# Patient Record
Sex: Female | Born: 1975 | Race: Asian | Hispanic: No | Smoking: Never smoker
Health system: Southern US, Community
[De-identification: ages and names within clinical notes are randomized; demographics above are authoritative.]

## PROBLEM LIST (undated history)

## (undated) ENCOUNTER — Inpatient Hospital Stay (HOSPITAL_COMMUNITY): Payer: Self-pay

## (undated) DIAGNOSIS — D649 Anemia, unspecified: Secondary | ICD-10-CM

## (undated) HISTORY — PX: APPENDECTOMY: SHX54

---

## 2011-01-25 ENCOUNTER — Other Ambulatory Visit: Payer: Self-pay | Admitting: Obstetrics and Gynecology

## 2011-01-25 DIAGNOSIS — O09529 Supervision of elderly multigravida, unspecified trimester: Secondary | ICD-10-CM

## 2011-01-25 LAB — HIV ANTIBODY (ROUTINE TESTING W REFLEX): HIV: NONREACTIVE

## 2011-01-25 LAB — RUBELLA ANTIBODY, IGM: Rubella: IMMUNE

## 2011-01-25 LAB — ABO/RH: RH Type: POSITIVE

## 2011-01-25 LAB — RPR: RPR: NONREACTIVE

## 2011-01-25 LAB — TYPE AND SCREEN: Antibody Screen: NEGATIVE

## 2011-02-01 ENCOUNTER — Other Ambulatory Visit: Payer: Self-pay | Admitting: Obstetrics and Gynecology

## 2011-02-01 ENCOUNTER — Ambulatory Visit (HOSPITAL_COMMUNITY)
Admission: RE | Admit: 2011-02-01 | Discharge: 2011-02-01 | Disposition: A | Discharge status: Home / Self Care | Payer: Medicaid Other | Source: Ambulatory Visit | Attending: Obstetrics and Gynecology | Admitting: Obstetrics and Gynecology

## 2011-02-01 ENCOUNTER — Encounter (HOSPITAL_COMMUNITY): Payer: Self-pay

## 2011-02-01 DIAGNOSIS — O358XX0 Maternal care for other (suspected) fetal abnormality and damage, not applicable or unspecified: Secondary | ICD-10-CM | POA: Insufficient documentation

## 2011-02-01 DIAGNOSIS — O09529 Supervision of elderly multigravida, unspecified trimester: Secondary | ICD-10-CM | POA: Insufficient documentation

## 2011-02-01 DIAGNOSIS — Z0489 Encounter for examination and observation for other specified reasons: Secondary | ICD-10-CM

## 2011-02-22 ENCOUNTER — Ambulatory Visit (HOSPITAL_COMMUNITY)
Admission: RE | Admit: 2011-02-22 | Discharge: 2011-02-22 | Disposition: A | Discharge status: Home / Self Care | Payer: Medicaid Other | Source: Ambulatory Visit | Attending: Obstetrics and Gynecology | Admitting: Obstetrics and Gynecology

## 2011-02-22 DIAGNOSIS — Z0489 Encounter for examination and observation for other specified reasons: Secondary | ICD-10-CM

## 2011-02-22 DIAGNOSIS — O09529 Supervision of elderly multigravida, unspecified trimester: Secondary | ICD-10-CM | POA: Insufficient documentation

## 2011-02-22 DIAGNOSIS — Z3689 Encounter for other specified antenatal screening: Secondary | ICD-10-CM | POA: Insufficient documentation

## 2011-02-22 DIAGNOSIS — IMO0002 Reserved for concepts with insufficient information to code with codable children: Secondary | ICD-10-CM

## 2011-05-08 LAB — STREP B DNA PROBE: GBS: NEGATIVE

## 2011-06-05 ENCOUNTER — Encounter (HOSPITAL_COMMUNITY)
Admission: AD | Disposition: A | Discharge status: Home / Self Care | Payer: Self-pay | Source: Ambulatory Visit | Attending: Obstetrics and Gynecology

## 2011-06-05 ENCOUNTER — Encounter (HOSPITAL_COMMUNITY): Payer: Self-pay | Admitting: *Deleted

## 2011-06-05 ENCOUNTER — Encounter (HOSPITAL_COMMUNITY): Payer: Self-pay | Admitting: Anesthesiology

## 2011-06-05 ENCOUNTER — Inpatient Hospital Stay (HOSPITAL_COMMUNITY): Payer: Medicaid Other | Admitting: Anesthesiology

## 2011-06-05 ENCOUNTER — Inpatient Hospital Stay (HOSPITAL_COMMUNITY)
Admission: AD | Admit: 2011-06-05 | Discharge: 2011-06-08 | DRG: 766 | Disposition: A | Discharge status: Home / Self Care | Payer: Medicaid Other | Source: Ambulatory Visit | Attending: Obstetrics and Gynecology | Admitting: Obstetrics and Gynecology

## 2011-06-05 ENCOUNTER — Inpatient Hospital Stay (HOSPITAL_COMMUNITY): Admission: RE | Admit: 2011-06-05 | Payer: Medicaid Other | Source: Ambulatory Visit

## 2011-06-05 ENCOUNTER — Other Ambulatory Visit: Payer: Self-pay | Admitting: Obstetrics and Gynecology

## 2011-06-05 DIAGNOSIS — D649 Anemia, unspecified: Secondary | ICD-10-CM | POA: Diagnosis present

## 2011-06-05 DIAGNOSIS — O24410 Gestational diabetes mellitus in pregnancy, diet controlled: Secondary | ICD-10-CM | POA: Diagnosis present

## 2011-06-05 DIAGNOSIS — Z98891 History of uterine scar from previous surgery: Secondary | ICD-10-CM

## 2011-06-05 DIAGNOSIS — O34219 Maternal care for unspecified type scar from previous cesarean delivery: Principal | ICD-10-CM | POA: Diagnosis present

## 2011-06-05 DIAGNOSIS — IMO0002 Reserved for concepts with insufficient information to code with codable children: Secondary | ICD-10-CM | POA: Diagnosis present

## 2011-06-05 DIAGNOSIS — O4100X0 Oligohydramnios, unspecified trimester, not applicable or unspecified: Secondary | ICD-10-CM | POA: Diagnosis present

## 2011-06-05 DIAGNOSIS — O09529 Supervision of elderly multigravida, unspecified trimester: Secondary | ICD-10-CM | POA: Diagnosis present

## 2011-06-05 LAB — TYPE AND SCREEN
ABO/RH(D): O POS
Antibody Screen: NEGATIVE

## 2011-06-05 LAB — CBC
HCT: 35.1 % — ABNORMAL LOW (ref 36.0–46.0)
MCHC: 32.5 g/dL (ref 30.0–36.0)
Platelets: 287 10*3/uL (ref 150–400)
RDW: 16.9 % — ABNORMAL HIGH (ref 11.5–15.5)

## 2011-06-05 LAB — ABO/RH: ABO/RH(D): O POS

## 2011-06-05 SURGERY — Surgical Case
Anesthesia: Regional | Site: Abdomen | Wound class: Clean Contaminated

## 2011-06-05 MED ORDER — MORPHINE SULFATE 10 MG/ML IJ SOLN
10.0000 mg | INTRAMUSCULAR | Status: DC | PRN
  Administered 2011-06-05: 10 mg via INTRAMUSCULAR
  Filled 2011-06-05: qty 1

## 2011-06-05 MED ORDER — SODIUM CHLORIDE 0.9 % IV SOLN
1.0000 ug/kg/h | INTRAVENOUS | Status: DC | PRN
  Filled 2011-06-05: qty 2.5

## 2011-06-05 MED ORDER — TETANUS-DIPHTH-ACELL PERTUSSIS 5-2.5-18.5 LF-MCG/0.5 IM SUSP
0.5000 mL | Freq: Once | INTRAMUSCULAR | Status: AC
  Administered 2011-06-06: 0.5 mL via INTRAMUSCULAR
  Filled 2011-06-05: qty 0.5

## 2011-06-05 MED ORDER — OXYTOCIN 20 UNITS IN LACTATED RINGERS INFUSION - SIMPLE
INTRAVENOUS | Status: AC | PRN
  Administered 2011-06-05 (×2): 20 [IU] via INTRAVENOUS

## 2011-06-05 MED ORDER — SENNOSIDES-DOCUSATE SODIUM 8.6-50 MG PO TABS
1.0000 | ORAL_TABLET | Freq: Every day | ORAL | Status: DC
  Administered 2011-06-05: 1 via ORAL
  Administered 2011-06-06: 2 via ORAL

## 2011-06-05 MED ORDER — IBUPROFEN 600 MG PO TABS
600.0000 mg | ORAL_TABLET | Freq: Four times a day (QID) | ORAL | Status: DC | PRN
  Filled 2011-06-05 (×5): qty 1

## 2011-06-05 MED ORDER — FENTANYL CITRATE 0.05 MG/ML IJ SOLN
INTRAMUSCULAR | Status: AC | PRN
  Administered 2011-06-05: 20 ug via INTRAVENOUS

## 2011-06-05 MED ORDER — WITCH HAZEL-GLYCERIN EX PADS: MEDICATED_PAD | CUTANEOUS | Status: DC | PRN

## 2011-06-05 MED ORDER — BUPIVACAINE HCL 0.75 % IJ SOLN
INTRAMUSCULAR | Status: AC | PRN
  Administered 2011-06-05: 15 mg

## 2011-06-05 MED ORDER — ZOLPIDEM TARTRATE 5 MG PO TABS: 5.0000 mg | ORAL_TABLET | Freq: Every evening | ORAL | Status: DC | PRN

## 2011-06-05 MED ORDER — DIPHENHYDRAMINE HCL 25 MG PO CAPS
25.0000 mg | ORAL_CAPSULE | Freq: Four times a day (QID) | ORAL | Status: DC | PRN
  Administered 2011-06-05: 25 mg via ORAL
  Filled 2011-06-05: qty 1

## 2011-06-05 MED ORDER — ONDANSETRON HCL 4 MG/2ML IJ SOLN
INTRAMUSCULAR | Status: AC | PRN
  Administered 2011-06-05: 4 mg via INTRAVENOUS

## 2011-06-05 MED ORDER — KETOROLAC TROMETHAMINE 30 MG/ML IJ SOLN: 30.0000 mg | Freq: Four times a day (QID) | INTRAMUSCULAR | Status: AC | PRN

## 2011-06-05 MED ORDER — KETOROLAC TROMETHAMINE 60 MG/2ML IM SOLN
60.0000 mg | Freq: Once | INTRAMUSCULAR | Status: AC | PRN
  Administered 2011-06-05: 60 mg via INTRAMUSCULAR

## 2011-06-05 MED ORDER — SIMETHICONE 80 MG PO CHEW: 80.0000 mg | CHEWABLE_TABLET | ORAL | Status: DC | PRN

## 2011-06-05 MED ORDER — IBUPROFEN 600 MG PO TABS
600.0000 mg | ORAL_TABLET | Freq: Four times a day (QID) | ORAL | Status: DC
  Administered 2011-06-06 – 2011-06-08 (×10): 600 mg via ORAL
  Filled 2011-06-05 (×6): qty 1

## 2011-06-05 MED ORDER — ONDANSETRON HCL 4 MG PO TABS: 4.0000 mg | ORAL_TABLET | ORAL | Status: DC | PRN

## 2011-06-05 MED ORDER — SIMETHICONE 80 MG PO CHEW
80.0000 mg | CHEWABLE_TABLET | Freq: Three times a day (TID) | ORAL | Status: DC
  Administered 2011-06-05 – 2011-06-08 (×8): 80 mg via ORAL

## 2011-06-05 MED ORDER — CITRIC ACID-SODIUM CITRATE 334-500 MG/5ML PO SOLN
30.0000 mL | Freq: Three times a day (TID) | ORAL | Status: DC
  Administered 2011-06-05: 30 mL via ORAL
  Filled 2011-06-05: qty 15

## 2011-06-05 MED ORDER — NALOXONE HCL 0.4 MG/ML IJ SOLN: 0.4000 mg | INTRAMUSCULAR | Status: DC | PRN

## 2011-06-05 MED ORDER — SODIUM CHLORIDE 0.9 % IR SOLN
Status: DC | PRN
  Administered 2011-06-05: 1000 mL

## 2011-06-05 MED ORDER — NALBUPHINE HCL 10 MG/ML IJ SOLN
5.0000 mg | INTRAMUSCULAR | Status: AC | PRN
  Filled 2011-06-05: qty 1

## 2011-06-05 MED ORDER — OXYCODONE-ACETAMINOPHEN 5-325 MG PO TABS
1.0000 | ORAL_TABLET | ORAL | Status: DC | PRN
  Administered 2011-06-05 – 2011-06-06 (×2): 2 via ORAL
  Administered 2011-06-06: 1 via ORAL
  Administered 2011-06-06: 2 via ORAL
  Administered 2011-06-06: 1 via ORAL
  Administered 2011-06-06 – 2011-06-08 (×9): 2 via ORAL
  Filled 2011-06-05: qty 1
  Filled 2011-06-05 (×2): qty 2
  Filled 2011-06-05: qty 1
  Filled 2011-06-05 (×3): qty 2
  Filled 2011-06-05: qty 1
  Filled 2011-06-05 (×4): qty 2
  Filled 2011-06-05: qty 1
  Filled 2011-06-05 (×2): qty 2

## 2011-06-05 MED ORDER — MORPHINE SULFATE (PF) 0.5 MG/ML IJ SOLN
INTRAMUSCULAR | Status: DC | PRN
  Administered 2011-06-05: .15 mg via EPIDURAL

## 2011-06-05 MED ORDER — LACTATED RINGERS IV SOLN
INTRAVENOUS | Status: DC
  Administered 2011-06-05 – 2011-06-06 (×6): via INTRAVENOUS

## 2011-06-05 MED ORDER — KETOROLAC TROMETHAMINE 60 MG/2ML IM SOLN
INTRAMUSCULAR | Status: AC
  Administered 2011-06-05: 60 mg via INTRAMUSCULAR
  Filled 2011-06-05: qty 2

## 2011-06-05 MED ORDER — FERROUS SULFATE 325 (65 FE) MG PO TABS
325.0000 mg | ORAL_TABLET | Freq: Two times a day (BID) | ORAL | Status: DC
  Administered 2011-06-06 – 2011-06-08 (×5): 325 mg via ORAL
  Filled 2011-06-05 (×5): qty 1

## 2011-06-05 MED ORDER — CEFAZOLIN SODIUM 1-5 GM-% IV SOLN
INTRAVENOUS | Status: AC | PRN
  Administered 2011-06-05: 1 g via INTRAVENOUS

## 2011-06-05 MED ORDER — MEASLES, MUMPS & RUBELLA VAC ~~LOC~~ INJ
0.5000 mL | INJECTION | Freq: Once | SUBCUTANEOUS | Status: DC
  Filled 2011-06-05: qty 0.5

## 2011-06-05 MED ORDER — SODIUM CHLORIDE 0.9 % IJ SOLN: 3.0000 mL | INTRAMUSCULAR | Status: DC | PRN

## 2011-06-05 MED ORDER — MEDROXYPROGESTERONE ACETATE 150 MG/ML IM SUSP: 150.0000 mg | INTRAMUSCULAR | Status: DC | PRN

## 2011-06-05 MED ORDER — ONDANSETRON HCL 4 MG/2ML IJ SOLN: 4.0000 mg | INTRAMUSCULAR | Status: DC | PRN

## 2011-06-05 MED ORDER — MENTHOL 3 MG MT LOZG: 1.0000 | LOZENGE | OROMUCOSAL | Status: DC | PRN

## 2011-06-05 MED ORDER — MAGNESIUM HYDROXIDE 400 MG/5ML PO SUSP: 30.0000 mL | ORAL | Status: DC | PRN

## 2011-06-05 MED ORDER — PRENATAL PLUS 27-1 MG PO TABS
1.0000 | ORAL_TABLET | Freq: Every day | ORAL | Status: DC
  Administered 2011-06-06 – 2011-06-08 (×3): 1 via ORAL
  Filled 2011-06-05 (×3): qty 1

## 2011-06-05 SURGICAL SUPPLY — 33 items
CLOTH BEACON ORANGE TIMEOUT ST (SAFETY) ×2 IMPLANT
DERMABOND ADVANCED (GAUZE/BANDAGES/DRESSINGS) IMPLANT
DRAPE UTILITY XL STRL (DRAPES) ×2 IMPLANT
DRESSING TELFA 8X3 (GAUZE/BANDAGES/DRESSINGS) IMPLANT
DURAPREP 26ML APPLICATOR (WOUND CARE) ×2 IMPLANT
ELECT REM PT RETURN 9FT ADLT (ELECTROSURGICAL) ×2
ELECTRODE REM PT RTRN 9FT ADLT (ELECTROSURGICAL) ×1 IMPLANT
EXTRACTOR VACUUM M CUP 4 TUBE (SUCTIONS) ×2 IMPLANT
GAUZE SPONGE 4X4 12PLY STRL LF (GAUZE/BANDAGES/DRESSINGS) IMPLANT
GLOVE BIO SURGEON STRL SZ 6.5 (GLOVE) ×2 IMPLANT
GLOVE BIOGEL PI IND STRL 7.0 (GLOVE) ×1 IMPLANT
GLOVE BIOGEL PI INDICATOR 7.0 (GLOVE) ×1
GLOVE ECLIPSE 6.0 STRL STRAW (GLOVE) ×2 IMPLANT
GLOVE INDICATOR 6.5 STRL GRN (GLOVE) ×2 IMPLANT
GLOVE INDICATOR 8.5 STRL (GLOVE) ×2 IMPLANT
GOWN BRE IMP SLV AUR LG STRL (GOWN DISPOSABLE) ×4 IMPLANT
GOWN BRE IMP SLV AUR XL STRL (GOWN DISPOSABLE) ×4 IMPLANT
KIT ABG SYR 3ML LUER SLIP (SYRINGE) IMPLANT
NEEDLE HYPO 25X5/8 SAFETYGLIDE (NEEDLE) IMPLANT
NS IRRIG 1000ML POUR BTL (IV SOLUTION) ×2 IMPLANT
PACK C SECTION WH (CUSTOM PROCEDURE TRAY) ×2 IMPLANT
PAD ABD 7.5X8 STRL (GAUZE/BANDAGES/DRESSINGS) IMPLANT
RTRCTR C-SECT PINK 25CM LRG (MISCELLANEOUS) IMPLANT
SLEEVE SCD COMPRESS KNEE MED (MISCELLANEOUS) IMPLANT
SUT CHROMIC 2 0 CT 1 (SUTURE) ×2 IMPLANT
SUT PLAIN 2 0 (SUTURE)
SUT PLAIN 2 0 XLH (SUTURE) ×2 IMPLANT
SUT PLAIN ABS 2-0 54XMFL TIE (SUTURE) IMPLANT
SUT VIC AB 0 CT1 36 (SUTURE) ×8 IMPLANT
SUT VIC AB 4-0 KS 27 (SUTURE) ×2 IMPLANT
TOWEL OR 17X24 6PK STRL BLUE (TOWEL DISPOSABLE) ×4 IMPLANT
TRAY FOLEY CATH 14FR (SET/KITS/TRAYS/PACK) IMPLANT
WATER STERILE IRR 1000ML POUR (IV SOLUTION) ×2 IMPLANT

## 2011-06-05 NOTE — Brief Op Note (Signed)
06/05/2011  9:57 AM  PATIENT:  Angelica Steele  35 y.o. female  PRE-OPERATIVE DIAGNOSIS:  previous cesarean section, in labor  POST-OPERATIVE DIAGNOSIS:  baby girl @ 0919  PROCEDURE:  Procedure(s): CESAREAN SECTION  SURGEON:  Surgeon(s): Fortino Sic, MD  PHYSICIAN ASSISTANT:   ASSISTANTS: none   ANESTHESIA:   spinal  ESTIMATED BLOOD LOSS: * No blood loss amount entered *   BLOOD ADMINISTERED:none  DRAINS: Urinary Catheter (Foley)   LOCAL MEDICATIONS USED:  NONE  SPECIMEN:  Source of Specimen:  placenta  DISPOSITION OF SPECIMEN:  PATHOLOGY  COUNTS:  YES    PLAN OF CARE:admit to mother baby  PATIENT DISPOSITION:  PACU - hemodynamically stable.   Delay start of Pharmacological VTE agent (>24hrs) due to surgical blood loss or risk of bleeding:  not applicable

## 2011-06-05 NOTE — H&P (Signed)
Angelica Steele is a 35 y.o. female presenting for Repeat Cesarean section. Maternal Medical History:  Reason for admission: Reason for admission: contractions.  40 wks, previous Cesarean section  Contractions: Onset was more than 2 days ago.   Frequency: irregular.   Duration is approximately 30 seconds.   Perceived severity is mild.    Fetal activity: Perceived fetal activity is normal.   Last perceived fetal movement was within the past hour.    Prenatal complications: IUGR and oligohydramnios.   Prenatal Complications - Diabetes: gestational. Diabetes is managed by diet.      OB History    Grav Para Term Preterm Abortions TAB SAB Ect Mult Living   2 1 1  0 0 0 0 0 0 1     Past Medical History  Diagnosis Date  . Diabetes mellitus Gestional Diet Controlled   Past Surgical History  Procedure Date  . Cesarean section   . Appendectomy 44yrs ago   Family History: family history is not on file. Social History:  reports that she has never smoked. She has never used smokeless tobacco. She reports that she does not drink alcohol or use illicit drugs.  Review of Systems  All other systems reviewed and are negative.    Dilation: 1.5 Effacement (%): 60 Station: -1 Exam by:: Mirage Pfefferkorn Blood pressure 137/90, pulse 77, height 5\' 2"  (1.575 m), weight 68.493 kg (151 lb). Maternal Exam:  Introitus: Normal vulva. Normal vagina.  Ferning test: not done.  Nitrazine test: not done. Amniotic fluid character: not assessed.  Pelvis: questionable for delivery.   Cervix: Cervix evaluated by digital exam.   1-2/60-70/0 to-1  vertex  Fetal Exam Fetal Monitor Review: Baseline rate: 140.  Variability: marked (>25 bpm).   Pattern: accelerations present and no decelerations.    Fetal State Assessment: Category I - tracings are normal.     Physical Exam  Vitals reviewed. Constitutional: She appears well-developed.  HENT:  Head: Normocephalic.  Neck: Neck supple.  Cardiovascular: Normal  rate.   Respiratory: Effort normal and breath sounds normal.  GI: Bowel sounds are normal.  Genitourinary: Vagina normal.  Musculoskeletal: Normal range of motion.  Neurological: She is alert.  Skin: Skin is warm.  Psychiatric: She has a normal mood and affect.    Prenatal labs: ABO, Rh:   Antibody: Negative (03/07 0000) Rubella:   RPR: Nonreactive (03/07 0000)  HBsAg: Negative (03/07 0000)  HIV: Non-reactive (03/07 0000)  GBS: Negative (06/18 0000)   Assessment/Plan: Repeat Low transverse Cesarean Section   Lamonta Cypress E 06/05/2011, 8:26 AM

## 2011-06-05 NOTE — Anesthesia Preprocedure Evaluation (Addendum)
Anesthesia Evaluation  Name, MR# and DOB Patient awake  General Assessment Comment  Reviewed: Allergy & Precautions, H&P  and Patient's Chart, lab work & pertinent test results  Airway Mallampati: II TM Distance: >3 FB Neck ROM: full    Dental No notable dental hx    Pulmonary  clear to auscultation  pulmonary exam normal   Cardiovascular Exercise Tolerance: Good regular Normal   Neuro/Psych  GI/Hepatic/Renal   Endo/Other   Abdominal   Musculoskeletal  Hematology   Peds  Reproductive/Obstetrics   Anesthesia Other Findings 11/35   [id number]            Anesthesia Physical Anesthesia Plan  ASA: II  Anesthesia Plan: Spinal   Post-op Pain Management:    Induction:   Airway Management Planned:   Additional Equipment:   Intra-op Plan:   Post-operative Plan:   Informed Consent: I have reviewed the patients History and Physical, chart, labs and discussed the procedure including the risks, benefits and alternatives for the proposed anesthesia with the patient or authorized representative who has indicated his/her understanding and acceptance.     Plan Discussed with:   Anesthesia Plan Comments: (Discussed spinal, and patient consents to the procedure:  included risk of possible headache,backache, failed block, allergic reaction, and nerve injury. This patient was asked if she had any questions or concerns before the procedure started )       Anesthesia Quick Evaluation

## 2011-06-05 NOTE — Consult Note (Signed)
Called to attend delivery at term gestation to a mother who is gestational diabetic on diet control. She has had a prior C/S but this is  privileged information not to be shared with the father or other family members. At birth infant in vertex, manually extracted after AROM (clear fluid) with spontaneous cries and active MAE . Infant given tactile stim and bulb suction to the airways. A nuchal cord (loose) x 1 was reported.   Infant given Apgar scores of 9/9 at one and five minutes.  RN kept infant in OR with mother.  Care to assigned pediatrician.  Judith Blonder MD Attending Neonatologist [facility]

## 2011-06-05 NOTE — Anesthesia Procedure Notes (Addendum)
Spinal Block  Patient location during procedure: OR Staffing Anesthesiologist: JACKSON, KYLE EDWARD Preanesthetic Checklist Completed: patient identified, site marked, surgical consent, pre-op evaluation, timeout performed, IV checked, risks and benefits discussed and monitors and equipment checked Spinal Block Patient position: sitting Prep: DuraPrep Patient monitoring: heart rate, cardiac monitor, continuous pulse ox and blood pressure Approach: midline Location: L3-4 Injection technique: single-shot Needle Needle type: Sprotte  Needle gauge: 24 G Needle length: 9 cm Assessment Sensory level: T4 Additional Notes Spinal Dosage in OR  Bupivicaine ml       1.5 PFMS04   mcg        150 Fentanyl mcg            20    

## 2011-06-05 NOTE — Anesthesia Postprocedure Evaluation (Signed)
  Anesthesia Post-op Note  Patient: Angelica Steele  Procedure(s) Performed:  CESAREAN SECTION  Patient is awake, responsive, moving her legs, and has signs of resolution of her numbness. Pain and nausea are reasonably well controlled. Vital signs are stable and clinically acceptable. Oxygen saturation is clinically acceptable. There are no apparent anesthetic complications at this time. Patient is ready for discharge.

## 2011-06-06 LAB — CBC
HCT: 25.7 % — ABNORMAL LOW (ref 36.0–46.0)
MCHC: 31.9 g/dL (ref 30.0–36.0)
Platelets: 201 10*3/uL (ref 150–400)
RDW: 17 % — ABNORMAL HIGH (ref 11.5–15.5)

## 2011-06-06 MED ORDER — CEFAZOLIN SODIUM 1-5 GM-% IV SOLN
1.0000 g | INTRAVENOUS | Status: AC
  Filled 2011-06-06: qty 50

## 2011-06-06 MED ORDER — FENTANYL CITRATE 0.05 MG/ML IJ SOLN: 25.0000 ug | INTRAMUSCULAR | Status: DC | PRN

## 2011-06-06 MED ORDER — ACETAMINOPHEN 325 MG PO TABS: 325.0000 mg | ORAL_TABLET | ORAL | Status: DC | PRN

## 2011-06-06 MED ORDER — OXYTOCIN 20 UNITS IN LACTATED RINGERS INFUSION - SIMPLE: 125.0000 mL/h | INTRAVENOUS | Status: AC

## 2011-06-06 MED ORDER — ONDANSETRON HCL 4 MG/2ML IJ SOLN: 4.0000 mg | Freq: Once | INTRAMUSCULAR | Status: AC | PRN

## 2011-06-06 MED ORDER — MEPERIDINE HCL 25 MG/ML IJ SOLN: 6.2500 mg | INTRAMUSCULAR | Status: DC | PRN

## 2011-06-06 NOTE — H&P (Addendum)
NAMEELARIA, Angelica Steele NO.:  [id number]  MEDICAL RECORD NO.:  [id number]  LOCATION:  9147                          FACILITY:  [facility]  PHYSICIAN:  Arlyce Harman, MD     DATE OF BIRTH:  [birth year]  DATE OF ADMISSION:  06/05/2011 DATE OF DISCHARGE:   Error: This is an op note not H & P. H & P separate.   PREOPERATIVE DIAGNOSES:  A 40- weeks pregnant, previous cesarean section, advanced maternal age, oligohydramnios, SGA, gestational diabetes, diet-controlled, anemia.  POSTOPERATIVE DIAGNOSES:  40- weeks pregnant, previous cesarean section, advanced maternal age, oligohydramnios, SGA, gestational diabetes, diet- controlled, anemia.  PROCEDURE:  Repeat low-transverse cervical cesarean section.  SURGEON:  Arlyce Harman, M.D.  No designated assistance.  DESCRIPTION OF FINDINGS:  A healthy, viable female with excellent Apgars, weight was 6 pounds, 3 ounces.  INDICATIONS AND CONSENT:  The patient is a 35 year old gravida 2, para 1 who recently informed me that she had a previous C-section in [place] and had a 8+ pound daughter that she had given for adoption.  This is information that she did not previously share because she did not want her husband to be aware of the previous pregnancy.  We discussed options for delivery and VBAC was considered; however, later declined.  Patient was admitted into labor and delivery on 7/16, having contractions and requested a cesarean section.  Risks, possible complications of cesarean section versus VBAC have been previously explained and informed consent was given for repeat C-section.  PROCEDURE IN DETAIL:  The patient was placed on a table in supine after spinal anesthesia had been induced.  The abdomen was sterilely prepped and draped in usual fashion after Foley catheter had been placed.  A Pfannenstiel incision was made into the skin, extended to the abdominal layer without difficulty.  The uterus was entered in a  low-transverse cervical fashion.  The membranes were brought forth and ruptured.  The fluid was clear.  The incision was extended and then the infant's head was delivered to know the mouth, was suctioned.  There was a nuchal cord x1, draped over the shoulder.  The infant was delivered without difficulty, it was a female with excellent Apgars.  The cord was clamped and cut and the infant was handed off to the pediatrician.  The placenta was manually removed.  The uterus was punched out and the uterine incision was closed with 0 Vicryl in a running locked fashion with second layer being that of a modified Lambert stitch.  The peritoneum was closed with 2-0 chromic in a running fashion.  The abdomen was irrigated and suctioned out.  Hemostasis was excellent.  The peritoneum was closed with 2-0 chromic in a running fashion.  The fascia was closed with 0 Vicryl in a running locked fashion.  Subcu layer was closed with 2-0 plain in a running fashion.  The skin was approximated using a Mellody Dance needle and a 4-0 Vicryl suture in a subcuticular fashion.  At this point, the procedure was terminated.  The patient was taken to the PACU in excellent condition.  ESTIMATED BLOOD LOSS:  1000 cc.     Arlyce Harman, MD    EG/MEDQ  D:  06/06/2011  T:  06/06/2011  Job:  [id number]

## 2011-06-06 NOTE — Progress Notes (Signed)
BREASTFEEDING CONSULTATION SERVICES INFORMATION GIVEN TO PATIENT.  PT TEARFUL DUE TO ABDOMINAL PAIN.  HUSBAND STATES PATIENT HAS BEEN INSTRUCTED AND ASSISTED WITH BASIC BREASTFEEDING TECHNIQUES BY [facility] RN'S AND THEY ARE FEELING CONFIDENT WITH FEEDINGS.  INSTRUCTED TO CALL FOR ASSIST OR CONCERNS WHEN NEEDED.

## 2011-06-06 NOTE — Progress Notes (Signed)
Subjective: Postpartum Day 1: Cesarean Delivery Patient reports no problems voiding.    Objective: Vital signs in last 24 hours: Temp:  [97.9 F (36.6 C)-98.6 F (37 C)] 98.5 F (36.9 C) (07/17 1359) Pulse Rate:  [62-87] 87  (07/17 1359) Resp:  [16-18] 17  (07/17 1359) BP: (98-132)/(65-85) 126/85 mmHg (07/17 1359) SpO2:  [95 %-98 %] 98 % (07/17 0847)  Physical Exam:  General: alert and no distress Lochia: appropriate Uterine Fundus: firm Incision: no significant drainage DVT Evaluation: No evidence of DVT seen on physical exam.   Basename 06/06/11 0515 06/05/11 0800  HGB 8.2* 11.4*  HCT 25.7* 35.1*    Assessment/Plan: Status post Cesarean section. Doing well postoperatively.  Continue current care.  Fortino Sic 06/06/2011, 4:25 PM

## 2011-06-06 NOTE — Progress Notes (Signed)
UR Chart review completed.  

## 2011-06-07 NOTE — Progress Notes (Signed)
Post Partum Day 2 Subjective: no complaints, up ad lib, voiding, tolerating PO and + flatus  Objective: Blood pressure 124/77, pulse 77, temperature 97.9 F (36.6 C), temperature source Oral, resp. rate 18, height 5\' 2"  (1.575 m), weight 68.493 kg (151 lb), SpO2 97.00%, unknown if currently breastfeeding.  Physical Exam:  General: alert and cooperative Lochia: appropriate Uterine Fundus: firm Incision: bandage clean and intact  DVT Evaluation: No evidence of DVT seen on physical exam.   Basename 06/06/11 0515 06/05/11 0800  HGB 8.2* 11.4*  HCT 25.7* 35.1*    Assessment/Plan: Plan for discharge tomorrow and Breastfeeding   LOS: 2 days   Angelica Steele J. 06/07/2011, 8:43 AM

## 2011-06-07 NOTE — Progress Notes (Signed)
Baby has missed a few feedings today, 4 1/2 hours between feedings this am, last feeding before this visit was 1630, parents report baby very fussy at breast. Attempted to latch baby, would latch and immediately come off breast becoming very fussy, will suck frantically on pacifier. Discussed supplementing with parents with medicine dropper to give appetizer to get baby to breast, parents agreed. Baby eagerly took 9ml of formula from medicine dropper, then attempted to latch to left breast. Baby still fussy, put small amount of formula on nipple and baby latched well and began rhythmic suck. Mom breast not symmetrical, left breast significantly larger than right. Mom reports breast changes in pregnancy, medical history of GDM. DEBP set up for mom and encouraged to begin postpumping for 15 minutes to encourage milk production. Advised if baby not breastfeeding every 3 hours for 15-20 minutes with active sucking and swallowing to supplement with 10-15 ml of EMB or EMB/formula with medicine dropper. Has WIC and plans to get pump from [facility]. Advised to follow up with Lactation before d/c tomorrow.

## 2011-06-08 MED ORDER — FERROUS SULFATE 325 (65 FE) MG PO TABS: 325.0000 mg | ORAL_TABLET | Freq: Two times a day (BID) | ORAL | Status: DC

## 2011-06-08 MED ORDER — IBUPROFEN 600 MG PO TABS: 600.0000 mg | ORAL_TABLET | Freq: Four times a day (QID) | ORAL | Status: AC | PRN

## 2011-06-08 MED ORDER — OXYCODONE-ACETAMINOPHEN 5-325 MG PO TABS: 1.0000 | ORAL_TABLET | ORAL | Status: AC | PRN

## 2011-06-08 NOTE — Progress Notes (Signed)
UR Chart review completed.  

## 2011-06-08 NOTE — Progress Notes (Signed)
  POD #3 s/p Ltcs Pt states pain is controlled. Decreased lochia Af vss Abdomen soft appropriately tender uterus firm  inciison c/d i Ext trace edema A/p pod #3 ltcs doing well  D/c home follow up with Dr. Neva Seat

## 2011-06-08 NOTE — Discharge Summary (Signed)
Obstetric Discharge Summary Reason for Admission: cesarean section Prenatal Procedures: none Intrapartum Procedures: cesarean: low cervical, transverse Postpartum Procedures: none Complications-Operative and Postpartum: none  Hemoglobin  Date Value Range Status  06/06/2011 8.2* 12.0-15.0 (g/dL) Final     REPEATED TO VERIFY     DELTA CHECK NOTED     HCT  Date Value Range Status  06/06/2011 25.7* 36.0-46.0 (%) Final    Discharge Diagnoses: Term Pregnancy-delivered  Discharge Information: Date: 06/08/2011 Activity: unrestricted Diet: routine Medications: PNV, Ibuprophen and Percocet Condition: stable Instructions: refer to practice specific booklet Discharge to: home Follow-up Information    Follow up with Fortino Sic, MD. Make an appointment in 2 weeks.   Contact information:   [address] [place] [address] [phone]          Newborn Data: Live born  Information for the patient's newborn:  Idamae, Coccia [[id number]]  female ; APGAR , ; weight ;  Home with .  Suvi Archuletta J. 06/08/2011, 6:41 AM

## 2011-06-08 NOTE — Progress Notes (Signed)
1st visited mom at 0900 , infant had recently fed . Due to 8% wt loss LC requested staying to check a latch check at the next feed .  (around 1130 ) . In the mean time , Discussed mom being on [employer] [facility] [employer] and recenty moved to [place] but had not transferred [facility] . LC called [facility] ,and they could not promise to give mom an appointment today. To make it easier on mom will give a [facility] loaner and 7 day loaner explained .Per Dad ok with renting if needed. Encouraged to call [facility] for  An appointment . Also reviewed engorgement tx. If needed .

## 2011-06-14 NOTE — Op Note (Signed)
Fortino Sic, MD Physician Signed H&P 06/05/2011 12:00 AM   NAME: Angelica Steele, Angelica Steele ACCOUNT NO.: [id number]  MEDICAL RECORD NO.: [id number]  LOCATION: 9147 FACILITY: [facility]  PHYSICIAN: Arlyce Harman, MD DATE OF BIRTH: [birth year]  DATE OF ADMISSION: 06/05/2011  DATE OF DISCHARGE:  HISTORY & PHYSICAL  PREOPERATIVE DIAGNOSES: A 40- weeks pregnant, previous cesarean  section, advanced maternal age, oligohydramnios, SGA, gestational  diabetes, diet-controlled, anemia.  POSTOPERATIVE DIAGNOSES: 40- weeks pregnant, previous cesarean section,  advanced maternal age, oligohydramnios, SGA, gestational diabetes, diet-  controlled, anemia.  PROCEDURE: Repeat low-transverse cervical cesarean section.  SURGEON: Arlyce Harman, M.D.  No designated assistance.  DESCRIPTION OF FINDINGS: A healthy, viable female with excellent  Apgars, weight was 6 pounds, 3 ounces.  INDICATIONS AND CONSENT: The patient is a 35 year old gravida 2, para 1  who recently informed me that she had a previous C-section in [place]  and had a 8+ pound daughter that she had given for adoption. This is  information that she did not previously share because she did not want  her husband to be aware of the previous pregnancy. We discussed options  for delivery and VBAC was considered; however, later declined. Patient  was admitted into labor and delivery on 7/16, having contractions and  requested a cesarean section. Risks, possible complications of cesarean  section versus VBAC have been previously explained and informed consent  was given for repeat C-section.  PROCEDURE IN DETAIL: The patient was placed on a table in supine after  spinal anesthesia had been induced. The abdomen was sterilely prepped  and draped in usual fashion after Foley catheter had been placed. A  Pfannenstiel incision was made into the skin, extended to the abdominal  layer without difficulty. The uterus was entered in a low-transverse  cervical fashion. The  membranes were brought forth and ruptured. The  fluid was clear. The incision was extended and then the infant's head  was delivered to know the mouth, was suctioned. There was a nuchal cord  x1, draped over the shoulder. The infant was delivered without  difficulty, it was a female with excellent Apgars. The cord was clamped  and cut and the infant was handed off to the pediatrician. The placenta  was manually removed. The uterus was punched out and the uterine  incision was closed with 0 Vicryl in a running locked fashion with  second layer being that of a modified Lambert stitch. The peritoneum  was closed with 2-0 chromic in a running fashion. The abdomen was  irrigated and suctioned out. Hemostasis was excellent. The peritoneum  was closed with 2-0 chromic in a running fashion. The fascia was closed  with 0 Vicryl in a running locked fashion. Subcu layer was closed with  2-0 plain in a running fashion. The skin was approximated using a  Mellody Dance needle and a 4-0 Vicryl suture in a subcuticular fashion. At this  point, the procedure was terminated. The patient was taken to the PACU  in excellent condition.  ESTIMATED BLOOD LOSS: 1000 cc.

## 2011-06-21 ENCOUNTER — Encounter (HOSPITAL_COMMUNITY): Payer: Self-pay | Admitting: Obstetrics and Gynecology

## 2012-01-29 ENCOUNTER — Other Ambulatory Visit: Payer: Self-pay | Admitting: Obstetrics and Gynecology

## 2012-01-29 ENCOUNTER — Other Ambulatory Visit (HOSPITAL_COMMUNITY)
Admission: RE | Admit: 2012-01-29 | Discharge: 2012-01-29 | Disposition: A | Discharge status: Home / Self Care | Payer: Medicaid Other | Source: Ambulatory Visit | Attending: Obstetrics and Gynecology | Admitting: Obstetrics and Gynecology

## 2012-01-29 DIAGNOSIS — Z124 Encounter for screening for malignant neoplasm of cervix: Secondary | ICD-10-CM | POA: Insufficient documentation

## 2012-07-09 ENCOUNTER — Other Ambulatory Visit: Payer: Self-pay | Admitting: Obstetrics and Gynecology

## 2012-10-20 ENCOUNTER — Encounter (HOSPITAL_COMMUNITY): Payer: Self-pay | Admitting: *Deleted

## 2012-10-20 ENCOUNTER — Inpatient Hospital Stay (HOSPITAL_COMMUNITY)
Admission: AD | Admit: 2012-10-20 | Discharge: 2012-10-20 | Disposition: A | Discharge status: Home / Self Care | Payer: Medicaid Other | Source: Ambulatory Visit | Attending: Obstetrics and Gynecology | Admitting: Obstetrics and Gynecology

## 2012-10-20 ENCOUNTER — Inpatient Hospital Stay (HOSPITAL_COMMUNITY): Payer: Medicaid Other

## 2012-10-20 DIAGNOSIS — O039 Complete or unspecified spontaneous abortion without complication: Secondary | ICD-10-CM | POA: Insufficient documentation

## 2012-10-20 LAB — URINALYSIS, ROUTINE W REFLEX MICROSCOPIC
Nitrite: NEGATIVE
Protein, ur: NEGATIVE mg/dL
Specific Gravity, Urine: 1.01 (ref 1.005–1.030)
Urobilinogen, UA: 0.2 mg/dL (ref 0.0–1.0)

## 2012-10-20 LAB — URINE MICROSCOPIC-ADD ON

## 2012-10-20 LAB — WET PREP, GENITAL

## 2012-10-20 MED ORDER — IBUPROFEN 800 MG PO TABS: 800.0000 mg | ORAL_TABLET | Freq: Three times a day (TID) | ORAL | Status: DC | PRN

## 2012-10-20 NOTE — MAU Note (Signed)
Pt reports she has had spotting since yesterday and started having dull cramping today.

## 2012-10-20 NOTE — MAU Note (Signed)
"  Bleeding started Friday light pink then went away.  It returned on Saturday as spotting all day.  The lower abd started this afternoon about 1400.  The pain is dull.  I am soaking a pad every 2-3 hours.  The bleeding is like my normal period.  There has been clots a few times."

## 2012-10-20 NOTE — MAU Provider Note (Signed)
History     CSN: [id number]  Arrival date and time: 10/20/12 1737   First Provider Initiated Contact with Patient 10/20/12 1826      Chief Complaint  Patient presents with  . Vaginal Bleeding   HPI 36 y.o. G2P1001 at [redacted]w[redacted]d with vaginal bleeding since Friday, spotting at first, now like a period. Dull pain starting today. Intercourse on Friday after bleeding started.  Past Medical History  Diagnosis Date  . Diabetes mellitus Gestional Diet Controlled  . No pertinent past medical history     Past Surgical History  Procedure Date  . Cesarean section   . Appendectomy 2yrs ago  . Cesarean section 06/05/2011    Procedure: CESAREAN SECTION;  Surgeon: Fortino Sic, MD;  Location: [facility];  Service: Gynecology;  Laterality: N/A;    History reviewed. No pertinent family history.  History  Substance Use Topics  . Smoking status: Never Smoker   . Smokeless tobacco: Never Used  . Alcohol Use: No    Allergies: No Known Allergies  Prescriptions prior to admission  Medication Sig Dispense Refill  . Multiple Vitamins-Minerals (MULTIVITAMIN GUMMIES ADULT PO) Take 2 tablets by mouth daily.      . Prenatal Vit-Fe Fumarate-FA (PRENATAL MULTIVITAMIN) TABS Take 1 tablet by mouth daily.        Review of Systems  Constitutional: Negative.   Respiratory: Negative.   Cardiovascular: Negative.   Gastrointestinal: Negative for nausea, vomiting, abdominal pain, diarrhea and constipation.  Genitourinary: Negative for dysuria, urgency, frequency, hematuria and flank pain.       + vaginal bleeding   Musculoskeletal: Negative.   Neurological: Negative.   Psychiatric/Behavioral: Negative.    Physical Exam   Blood pressure 157/90, pulse 85, temperature 98.2 F (36.8 C), temperature source Oral, resp. rate 18, height 5\' 2"  (1.575 m), weight 143 lb 6.4 oz (65.046 kg).  Physical Exam  Nursing note and vitals reviewed. Constitutional: She is oriented to person, place, and time. She  appears well-developed and well-nourished. No distress.  HENT:  Head: Normocephalic and atraumatic.  Cardiovascular: Normal rate and regular rhythm.   Respiratory: Effort normal. No respiratory distress.  GI: Soft. She exhibits no distension and no mass. There is no tenderness. There is no rebound and no guarding.  Genitourinary: There is no rash or lesion on the right labia. There is no rash or lesion on the left labia. Uterus is not deviated, not enlarged, not fixed and not tender. Cervix exhibits no motion tenderness, no discharge and no friability. Right adnexum displays tenderness. Right adnexum displays no mass and no fullness. Left adnexum displays no mass, no tenderness and no fullness. There is bleeding (moderate, small clot in cervix) around the vagina. No erythema or tenderness around the vagina. No vaginal discharge found.       Cervix closed   Neurological: She is alert and oriented to person, place, and time.  Skin: Skin is warm and dry.  Psychiatric: She has a normal mood and affect.    MAU Course  Procedures Results for orders placed during the hospital encounter of 10/20/12 (from the past 24 hour(s))  URINALYSIS, ROUTINE W REFLEX MICROSCOPIC     Status: Abnormal   Collection Time   10/20/12  5:30 PM      Component Value Range   Color, Urine RED (*) YELLOW   APPearance CLOUDY (*) CLEAR   Specific Gravity, Urine 1.010  1.005 - 1.030   pH 7.5  5.0 - 8.0   Glucose, UA NEGATIVE  NEGATIVE mg/dL   Hgb urine dipstick LARGE (*) NEGATIVE   Bilirubin Urine NEGATIVE  NEGATIVE   Ketones, ur NEGATIVE  NEGATIVE mg/dL   Protein, ur NEGATIVE  NEGATIVE mg/dL   Urobilinogen, UA 0.2  0.0 - 1.0 mg/dL   Nitrite NEGATIVE  NEGATIVE   Leukocytes, UA NEGATIVE  NEGATIVE  URINE MICROSCOPIC-ADD ON     Status: Abnormal   Collection Time   10/20/12  5:30 PM      Component Value Range   Squamous Epithelial / LPF RARE  RARE   WBC, UA 0-2  <3 WBC/hpf   RBC / HPF TOO NUMEROUS TO COUNT  <3 RBC/hpf     Bacteria, UA MANY (*) RARE  WET PREP, GENITAL     Status: Abnormal   Collection Time   10/20/12  6:23 PM      Component Value Range   Yeast Wet Prep HPF POC NONE SEEN  NONE SEEN   Trich, Wet Prep NONE SEEN  NONE SEEN   Clue Cells Wet Prep HPF POC NONE SEEN  NONE SEEN   WBC, Wet Prep HPF POC FEW (*) NONE SEEN   U/S: 8 week size fetus with no cardiac activity, irregular gestational sac  Assessment and Plan   1. SAB (spontaneous abortion)   Bleeding precautions rev'd    Medication List     As of 10/20/2012  7:54 PM    START taking these medications         ibuprofen 800 MG tablet   Commonly known as: ADVIL,MOTRIN   Take 1 tablet (800 mg total) by mouth every 8 (eight) hours as needed for pain.      CONTINUE taking these medications         MULTIVITAMIN GUMMIES ADULT PO      prenatal multivitamin Tabs          Where to get your medications    These are the prescriptions that you need to pick up. We sent them to a specific pharmacy, so you will need to go there to get them.   [facility] [address] [address], [place] - [address] AT [facility] [address] [facility] [address]    [address] [place] [address]    Phone: [phone]        ibuprofen 800 MG tablet            Follow-up Information    Follow up with Fortino Sic, MD. On 10/21/2012.   Contact information:   [address] [place] [address] [phone]            [facility] 10/20/2012, 6:56 PM

## 2012-10-22 LAB — GC/CHLAMYDIA PROBE AMP
CT Probe RNA: NEGATIVE
GC Probe RNA: NEGATIVE

## 2012-10-24 ENCOUNTER — Inpatient Hospital Stay (HOSPITAL_COMMUNITY): Payer: Medicaid Other

## 2012-10-24 ENCOUNTER — Inpatient Hospital Stay (HOSPITAL_COMMUNITY)
Admission: AD | Admit: 2012-10-24 | Discharge: 2012-10-24 | Disposition: A | Discharge status: Home / Self Care | Payer: Medicaid Other | Source: Ambulatory Visit | Attending: Obstetrics and Gynecology | Admitting: Obstetrics and Gynecology

## 2012-10-24 ENCOUNTER — Encounter (HOSPITAL_COMMUNITY): Payer: Self-pay | Admitting: *Deleted

## 2012-10-24 DIAGNOSIS — O021 Missed abortion: Secondary | ICD-10-CM | POA: Insufficient documentation

## 2012-10-24 DIAGNOSIS — O039 Complete or unspecified spontaneous abortion without complication: Secondary | ICD-10-CM

## 2012-10-24 HISTORY — DX: Anemia, unspecified: D64.9

## 2012-10-24 LAB — CBC
Hemoglobin: 12.9 g/dL (ref 12.0–15.0)
MCHC: 33.9 g/dL (ref 30.0–36.0)
Platelets: 275 10*3/uL (ref 150–400)
RBC: 4.12 MIL/uL (ref 3.87–5.11)

## 2012-10-24 MED ORDER — OXYCODONE-ACETAMINOPHEN 5-325 MG PO TABS: 1.0000 | ORAL_TABLET | ORAL | Status: DC | PRN

## 2012-10-24 MED ORDER — OXYCODONE-ACETAMINOPHEN 5-325 MG PO TABS
1.0000 | ORAL_TABLET | Freq: Once | ORAL | Status: AC
  Administered 2012-10-24: 1 via ORAL
  Filled 2012-10-24: qty 1

## 2012-10-24 NOTE — Progress Notes (Signed)
Visited with Angelica Steele and her significant other briefly.  They were appreciative of the visit, but they did not wish to talk further at this time.  They are both aware of on-going resources for support in the community.  [employer] Pager, [phone]

## 2012-10-24 NOTE — MAU Note (Signed)
Started spotting on Fri/ Sat started cramping on Sun- dx with missed AB. Had [place] at 7wk, every thing was fine; Korea this weekend showed fetus stopped growing at 8 wks.  Plan was for expected management;  Started bleeding heavy this morning; and cramping got really bad.  Had bled through clothes when arrived.

## 2012-10-24 NOTE — MAU Provider Note (Signed)
History     CSN: [id number]  Arrival date and time: 10/24/12 4098   First Provider Initiated Contact with Patient 10/24/12 [phone]      Chief Complaint  Patient presents with  . Vaginal Bleeding  . Abdominal Pain   HPI This is a 36 y.o. female at [redacted]w[redacted]d who has a known Missed Abortion (seen on 12/1) who presents with c/o heavy bleeding this morning. States soaked through two large pads. Did not see any clots. Has some cramping. Called Dr Dion Body and was told to come in.   OB History    Grav Para Term Preterm Abortions TAB SAB Ect Mult Living   2 1 1  0 0 0 0 0 0 1      Past Medical History  Diagnosis Date  . Diabetes mellitus Gestional Diet Controlled  . Anemia     Past Surgical History  Procedure Date  . Cesarean section   . Appendectomy 53yrs ago  . Cesarean section 06/05/2011    Procedure: CESAREAN SECTION;  Surgeon: Fortino Sic, MD;  Location: [facility];  Service: Gynecology;  Laterality: N/A;    Family History  Problem Relation Age of Onset  . Other Neg Hx     History  Substance Use Topics  . Smoking status: Never Smoker   . Smokeless tobacco: Never Used  . Alcohol Use: No    Allergies: No Known Allergies  Prescriptions prior to admission  Medication Sig Dispense Refill  . ibuprofen (ADVIL,MOTRIN) 800 MG tablet Take 1 tablet (800 mg total) by mouth every 8 (eight) hours as needed for pain.  30 tablet  0  . Prenatal Vit-Fe Fumarate-FA (PRENATAL MULTIVITAMIN) TABS Take 1 tablet by mouth daily.        ROS See HPI  Physical Exam   Blood pressure 134/87, pulse 98, temperature 99.5 F (37.5 C), temperature source Oral, resp. rate 20.  Physical Exam  Constitutional: She is oriented to person, place, and time. She appears well-developed and well-nourished. No distress.  Cardiovascular: Normal rate.   Respiratory: Effort normal.  GI: Soft. She exhibits no distension and no mass. There is tenderness (over suprapubic area). There is no rebound and no  guarding.  Genitourinary: Uterus normal. Vaginal discharge (small vaginal bleeding) found.  Musculoskeletal: Normal range of motion.  Neurological: She is alert and oriented to person, place, and time.  Skin: Skin is warm and dry.  Psychiatric: She has a normal mood and affect.    MAU Course  Procedures  MDM Discussed with Dr Neva Seat. She offered for pt to come to office, but pt prefers to stay here. Will check CBC and Korea. Results for orders placed during the hospital encounter of 10/24/12 (from the past 72 hour(s))  CBC     Status: Normal   Collection Time   10/24/12  8:43 AM      Component Value Range Comment   WBC 10.0  4.0 - 10.5 K/uL    RBC 4.12  3.87 - 5.11 MIL/uL    Hemoglobin 12.9  12.0 - 15.0 g/dL    HCT 47.8  29.5 - 62.1 %    MCV 92.5  78.0 - 100.0 fL    MCH 31.3  26.0 - 34.0 pg    MCHC 33.9  30.0 - 36.0 g/dL    RDW 30.8  65.7 - 84.6 %    Platelets 275  150 - 400 K/uL    US showed passage of fetus and sac. There are still some retained products  in lower uterine segment.   Assessment and Plan  A:  SIUP at [redacted]w[redacted]d with demise and passage of sac      Stable bleeding with good hemoglobin level      Small amount retained POC in lower uterine segment  P:  Discussed with DR Neva Seat.      Discharge home       Reviewed what to expect and what to call for      Follow up with Dr Neva Seat in a week  [facility] 10/24/2012, 8:43 AM

## 2013-08-25 ENCOUNTER — Encounter (HOSPITAL_COMMUNITY): Payer: Self-pay | Admitting: *Deleted

## 2013-11-11 ENCOUNTER — Ambulatory Visit (HOSPITAL_BASED_OUTPATIENT_CLINIC_OR_DEPARTMENT_OTHER): Payer: Medicaid Other | Attending: Family Medicine | Admitting: Radiology

## 2013-11-11 VITALS — Ht 62.0 in | Wt 135.0 lb

## 2013-11-11 DIAGNOSIS — R0989 Other specified symptoms and signs involving the circulatory and respiratory systems: Secondary | ICD-10-CM | POA: Insufficient documentation

## 2013-11-11 DIAGNOSIS — R0609 Other forms of dyspnea: Secondary | ICD-10-CM | POA: Insufficient documentation

## 2013-11-11 DIAGNOSIS — G4700 Insomnia, unspecified: Secondary | ICD-10-CM | POA: Insufficient documentation

## 2013-11-11 DIAGNOSIS — R5381 Other malaise: Secondary | ICD-10-CM

## 2013-11-15 DIAGNOSIS — R5383 Other fatigue: Secondary | ICD-10-CM

## 2013-11-15 DIAGNOSIS — R5381 Other malaise: Secondary | ICD-10-CM

## 2013-11-15 NOTE — Sleep Study (Signed)
   NAME: Angelica Steele DATE OF BIRTH:  1976-05-13 MEDICAL RECORD NUMBER [id number]  LOCATION: [facility]  PHYSICIAN: YOUNG,CLINTON D  DATE OF STUDY: 11/11/2013  SLEEP STUDY TYPE: Nocturnal Polysomnogram               REFERRING PHYSICIAN: Renaye Rakers, MD  INDICATION FOR STUDY: For insomnia with sleep apnea  EPWORTH SLEEPINESS SCORE:   2/24 HEIGHT: 5\' 2"  (157.5 cm)  WEIGHT: 61.236 kg (135 lb)    Body mass index is 24.69 kg/(m^2).  NECK SIZE: 13 in.  MEDICATIONS: Charted for review  SLEEP ARCHITECTURE: Total sleep time 445.5 minutes with sleep efficiency 90.1%. Stage I was 8.6%, stage II 64.8%, stage III absent, REM 26.6% of total sleep time. Sleep latency 3.5 minutes, REM latency 135.5 minutes, awake after sleep onset 5 minutes, arousal index 7. Bedtime medication: None  RESPIRATORY DATA: Apnea hypopnea index 1.9 per hour. A total of 14 events were scored including 2 obstructive apneas, 2 central apneas, 10 hypopneas. Events were more common while supine. REM AHI 2 per hour. There were not enough events to permit split protocol CPAP titration.   OXYGEN DATA: Intermittent mild to moderate snoring with oxygen desaturation to a nadir of 86% and mean oxygen saturation through the study of 95.7% on room air.  CARDIAC DATA: Normal sinus rhythm  MOVEMENT/PARASOMNIA: No significant movement disturbance. No bathroom trips. Bruxism  IMPRESSION/ RECOMMENDATION:   1) Unremarkable sleep architecture with no bedtime medication. No unusual sleep disturbance or significant movement disturbance. 2) Occasional respiratory events with sleep disturbance, within normal limits.  AHI 1.9 per hour (the normal range for adults is an AHI from 0-5 events per hour). Intermittent mild to moderate snoring with oxygen desaturation to a nadir of 86% and mean oxygen saturation to the study of 95.7% on room air.  3) Bruxism noted once  Waymon Budge Diplomate, American Board of Sleep  Medicine  ELECTRONICALLY SIGNED ON:  11/15/2013, 11:27 AM [facility] PH: [phone]   FX: [phone] ACCREDITED BY THE AMERICAN ACADEMY OF SLEEP MEDICINE

## 2014-03-13 ENCOUNTER — Emergency Department (HOSPITAL_COMMUNITY)
Admission: EM | Admit: 2014-03-13 | Discharge: 2014-03-14 | Disposition: A | Discharge status: Home / Self Care | Payer: Medicaid Other | Attending: Emergency Medicine | Admitting: Emergency Medicine

## 2014-03-13 ENCOUNTER — Emergency Department (HOSPITAL_COMMUNITY): Payer: Medicaid Other

## 2014-03-13 ENCOUNTER — Encounter (HOSPITAL_COMMUNITY): Payer: Self-pay | Admitting: Emergency Medicine

## 2014-03-13 DIAGNOSIS — Z9089 Acquired absence of other organs: Secondary | ICD-10-CM | POA: Insufficient documentation

## 2014-03-13 DIAGNOSIS — K529 Noninfective gastroenteritis and colitis, unspecified: Secondary | ICD-10-CM

## 2014-03-13 DIAGNOSIS — Z8632 Personal history of gestational diabetes: Secondary | ICD-10-CM | POA: Insufficient documentation

## 2014-03-13 DIAGNOSIS — Z3202 Encounter for pregnancy test, result negative: Secondary | ICD-10-CM | POA: Insufficient documentation

## 2014-03-13 DIAGNOSIS — D649 Anemia, unspecified: Secondary | ICD-10-CM | POA: Insufficient documentation

## 2014-03-13 DIAGNOSIS — Z79899 Other long term (current) drug therapy: Secondary | ICD-10-CM | POA: Insufficient documentation

## 2014-03-13 DIAGNOSIS — K6389 Other specified diseases of intestine: Secondary | ICD-10-CM | POA: Insufficient documentation

## 2014-03-13 DIAGNOSIS — Z9889 Other specified postprocedural states: Secondary | ICD-10-CM | POA: Insufficient documentation

## 2014-03-13 LAB — URINALYSIS, ROUTINE W REFLEX MICROSCOPIC
Bilirubin Urine: NEGATIVE
GLUCOSE, UA: NEGATIVE mg/dL
HGB URINE DIPSTICK: NEGATIVE
Ketones, ur: NEGATIVE mg/dL
Nitrite: NEGATIVE
PH: 6 (ref 5.0–8.0)
Protein, ur: NEGATIVE mg/dL
SPECIFIC GRAVITY, URINE: 1.018 (ref 1.005–1.030)
Urobilinogen, UA: 0.2 mg/dL (ref 0.0–1.0)

## 2014-03-13 LAB — BASIC METABOLIC PANEL
BUN: 9 mg/dL (ref 6–23)
CHLORIDE: 101 meq/L (ref 96–112)
CO2: 24 meq/L (ref 19–32)
Calcium: 9.7 mg/dL (ref 8.4–10.5)
Creatinine, Ser: 0.59 mg/dL (ref 0.50–1.10)
GFR calc Af Amer: >90 mL/min (ref >90)
GFR calc non Af Amer: >90 mL/min (ref >90)
GLUCOSE: 122 mg/dL — AB (ref 70–99)
POTASSIUM: 4.2 meq/L (ref 3.7–5.3)
SODIUM: 139 meq/L (ref 137–147)

## 2014-03-13 LAB — CBC
HEMATOCRIT: 39.2 % (ref 36.0–46.0)
HEMOGLOBIN: 12.9 g/dL (ref 12.0–15.0)
MCH: 29.9 pg (ref 26.0–34.0)
MCHC: 32.9 g/dL (ref 30.0–36.0)
MCV: 91 fL (ref 78.0–100.0)
Platelets: 294 10*3/uL (ref 150–400)
RBC: 4.31 MIL/uL (ref 3.87–5.11)
RDW: 13.7 % (ref 11.5–15.5)
WBC: 10.8 10*3/uL — AB (ref 4.0–10.5)

## 2014-03-13 LAB — PREGNANCY, URINE: PREG TEST UR: NEGATIVE

## 2014-03-13 LAB — URINE MICROSCOPIC-ADD ON

## 2014-03-13 MED ORDER — KETOROLAC TROMETHAMINE 30 MG/ML IJ SOLN
30.0000 mg | Freq: Once | INTRAMUSCULAR | Status: AC
  Administered 2014-03-13: 30 mg via INTRAVENOUS
  Filled 2014-03-13: qty 1

## 2014-03-13 MED ORDER — HYDROMORPHONE HCL PF 1 MG/ML IJ SOLN: 1.0000 mg | Freq: Once | INTRAMUSCULAR | Status: DC

## 2014-03-13 MED ORDER — HYDROMORPHONE HCL PF 1 MG/ML IJ SOLN
1.0000 mg | Freq: Once | INTRAMUSCULAR | Status: AC
  Administered 2014-03-13: 1 mg via INTRAVENOUS
  Filled 2014-03-13: qty 1

## 2014-03-13 NOTE — ED Notes (Signed)
Pt c/o L side sharp abd pain onset 1830 tonight. Denies urinary s/s. Denies n/v/d

## 2014-03-13 NOTE — ED Provider Notes (Signed)
CSN: [id number]     Arrival date & time 03/13/14  2016 History   First MD Initiated Contact with Patient 03/13/14 2029     Chief Complaint  Patient presents with  . Abdominal Pain     (Consider location/radiation/quality/duration/timing/severity/associated sxs/prior Treatment) Patient is a 38 y.o. female presenting with abdominal pain. The history is provided by the patient.  Abdominal Pain Pain location:  LLQ Pain quality: sharp   Pain radiates to:  Does not radiate Pain severity:  Severe Onset quality:  Sudden Duration:  2 hours Timing:  Constant Progression:  Unchanged Chronicity:  New Context: not recent illness, not sick contacts and not trauma   Relieved by:  Nothing Worsened by:  Movement Ineffective treatments:  None tried Associated symptoms: no chills, no cough, no diarrhea, no fever, no hematuria, no nausea and no vomiting     Past Medical History  Diagnosis Date  . Anemia   . Diabetes mellitus Gestional Diet Controlled   Past Surgical History  Procedure Laterality Date  . Cesarean section    . Appendectomy  2341yrs ago  . Cesarean section  06/05/2011    Procedure: CESAREAN SECTION;  Surgeon: Fortino SicEleanor E Greene, MD;  Location: [facility];  Service: Gynecology;  Laterality: N/A;   Family History  Problem Relation Age of Onset  . Other Neg Hx    History  Substance Use Topics  . Smoking status: Never Smoker   . Smokeless tobacco: Never Used  . Alcohol Use: No   OB History   Grav Para Term Preterm Abortions TAB SAB Ect Mult Living   2 1 1  0 0 0 0 0 0 1     Review of Systems  Constitutional: Negative for fever and chills.  Respiratory: Negative for cough.   Gastrointestinal: Positive for abdominal pain. Negative for nausea, vomiting and diarrhea.  Genitourinary: Negative for hematuria.  All other systems reviewed and are negative.     Allergies  Review of patient's allergies indicates no known allergies.  Home Medications   Prior to Admission  medications   Medication Sig Start Date End Date Taking? Authorizing Provider  ALPRAZolam Prudy Feeler(XANAX) 0.5 MG tablet Take 0.5 mg by mouth at bedtime as needed for anxiety (anxiety).   Yes Historical Provider, MD  Multiple Vitamins-Minerals (MULTIVITAMIN WITH MINERALS) tablet Take 1 tablet by mouth daily.   Yes Historical Provider, MD  vitamin B-12 (CYANOCOBALAMIN) 100 MCG tablet Take 100 mcg by mouth daily.   Yes Historical Provider, MD   BP 161/96  Pulse 95  Temp(Src) 98.4 F (36.9 C) (Oral)  Resp 18  Wt 130 lb (58.968 kg)  SpO2 99%  LMP 02/12/2014 Physical Exam  Nursing note and vitals reviewed. Constitutional: She is oriented to person, place, and time. She appears well-developed and well-nourished. No distress.  HENT:  Head: Normocephalic and atraumatic.  Eyes: EOM are normal. Pupils are equal, round, and reactive to light.  Neck: Normal range of motion. Neck supple.  Cardiovascular: Normal rate and regular rhythm.  Exam reveals no friction rub.   No murmur heard. Pulmonary/Chest: Effort normal and breath sounds normal. No respiratory distress. She has no wheezes. She has no rales.  Abdominal: Soft. She exhibits no distension. There is tenderness (LLQ). There is no rebound.  No L flank or L CVA tenderness  Musculoskeletal: Normal range of motion. She exhibits no edema.  Neurological: She is alert and oriented to person, place, and time.  Skin: She is not diaphoretic.    ED Course  Procedures (including critical care time) Labs Review Labs Reviewed  CBC  URINALYSIS, ROUTINE W REFLEX MICROSCOPIC  BASIC METABOLIC PANEL    Imaging Review Ct Abdomen Pelvis Wo Contrast  03/14/2014   CLINICAL DATA:  Left-sided flank pain.  EXAM: CT ABDOMEN AND PELVIS WITHOUT CONTRAST  TECHNIQUE: Multidetector CT imaging of the abdomen and pelvis was performed following the standard protocol without intravenous contrast.  COMPARISON:  None.  FINDINGS: The visualized lung bases are clear.  There is  diffuse fatty infiltration within the liver, with mild sparing about the gallbladder fossa. The gallbladder is within normal limits. The pancreas and adrenal glands are unremarkable.  The kidneys are unremarkable in appearance. There is no evidence of hydronephrosis. No renal or ureteral stones are seen. No perinephric stranding is appreciated.  No free fluid is identified. The small bowel is unremarkable in appearance. The stomach is within normal limits. No acute vascular abnormalities are seen.  Mild focal soft tissue inflammation is noted along the inferior left paracolic gutter, adjacent to the descending colon. No associated diverticulosis is seen, and the underlying colon appears intact. Given the appearance on coronal images, this is thought most likely to reflect epiploic appendagitis.  The patient is status post appendectomy. The colon is grossly unremarkable in appearance.  The bladder is mildly distended; apparent anterior bladder wall thickening is thought to reflect prior surgery, given overlying surgical changes. No inguinal lymphadenopathy is seen.  No acute osseous abnormalities are identified.  IMPRESSION: 1. Mild focal soft tissue inflammation along the inferior left paracolic gutter, adjacent to the descending colon. The appearance is most compatible with epiploic appendagitis. The underlying colon appears grossly intact. 2. No evidence of hydronephrosis; no obstructing ureteral stones seen. 3. Diffuse fatty infiltration within the liver.   Electronically Signed   By: Roanna Raider M.D.   On: 03/14/2014 00:05   US Transvaginal Non-ob  03/13/2014   CLINICAL DATA:  Left groin pain.  Evaluate for torsion.  EXAM: TRANSABDOMINAL AND TRANSVAGINAL ULTRASOUND OF PELVIS  DOPPLER ULTRASOUND OF OVARIES  TECHNIQUE: Both transabdominal and transvaginal ultrasound examinations of the pelvis were performed. Transabdominal technique was performed for global imaging of the pelvis including uterus, ovaries,  adnexal regions, and pelvic cul-de-sac.  It was necessary to proceed with endovaginal exam following the transabdominal exam to visualize the uterus, endometrium, and adnexa. Color and duplex Doppler ultrasound was utilized to evaluate blood flow to the ovaries.  COMPARISON:  None.  FINDINGS: Uterus  Measurements: 6.0 x 3.9 x 4.6 cm. No fibroids or other mass visualized.  Endometrium  Thickness: 10 mm.  No focal abnormality visualized.  Right ovary  Measurements: 1.9 x 1.2 x 2.3 cm. Normal appearance/no adnexal mass.  Left ovary  Measurements: 2.2 x 1.5 x 2.3 cm. Cystic lesion in the left ovary with low-level internal echoes and somewhat irregular borders measures 1.2 x 1.1 x 1.1 cm. No internal vascularity.  Pulsed Doppler evaluation of both ovaries demonstrates normal low-resistance arterial and venous waveforms.  Other findings  Trace free fluid.  IMPRESSION: 1. No evidence of ovarian torsion. 2. 1.2 cm cystic lesion in the left ovary, possibly a corpus luteum. Consider follow-up ultrasound in 6 weeks to confirm resolution. 3. Unremarkable appearance of the uterus.   Electronically Signed   By: Sebastian Ache   On: 03/13/2014 22:07   US Pelvis Complete  03/13/2014   CLINICAL DATA:  Left groin pain.  Evaluate for torsion.  EXAM: TRANSABDOMINAL AND TRANSVAGINAL ULTRASOUND OF PELVIS  DOPPLER ULTRASOUND  OF OVARIES  TECHNIQUE: Both transabdominal and transvaginal ultrasound examinations of the pelvis were performed. Transabdominal technique was performed for global imaging of the pelvis including uterus, ovaries, adnexal regions, and pelvic cul-de-sac.  It was necessary to proceed with endovaginal exam following the transabdominal exam to visualize the uterus, endometrium, and adnexa. Color and duplex Doppler ultrasound was utilized to evaluate blood flow to the ovaries.  COMPARISON:  None.  FINDINGS: Uterus  Measurements: 6.0 x 3.9 x 4.6 cm. No fibroids or other mass visualized.  Endometrium  Thickness: 10 mm.  No  focal abnormality visualized.  Right ovary  Measurements: 1.9 x 1.2 x 2.3 cm. Normal appearance/no adnexal mass.  Left ovary  Measurements: 2.2 x 1.5 x 2.3 cm. Cystic lesion in the left ovary with low-level internal echoes and somewhat irregular borders measures 1.2 x 1.1 x 1.1 cm. No internal vascularity.  Pulsed Doppler evaluation of both ovaries demonstrates normal low-resistance arterial and venous waveforms.  Other findings  Trace free fluid.  IMPRESSION: 1. No evidence of ovarian torsion. 2. 1.2 cm cystic lesion in the left ovary, possibly a corpus luteum. Consider follow-up ultrasound in 6 weeks to confirm resolution. 3. Unremarkable appearance of the uterus.   Electronically Signed   By: Sebastian AcheAllen  Grady   On: 03/13/2014 22:07   [place] Art/ven Flow Abd Pelv Doppler  03/13/2014   CLINICAL DATA:  Left groin pain.  Evaluate for torsion.  EXAM: TRANSABDOMINAL AND TRANSVAGINAL ULTRASOUND OF PELVIS  DOPPLER ULTRASOUND OF OVARIES  TECHNIQUE: Both transabdominal and transvaginal ultrasound examinations of the pelvis were performed. Transabdominal technique was performed for global imaging of the pelvis including uterus, ovaries, adnexal regions, and pelvic cul-de-sac.  It was necessary to proceed with endovaginal exam following the transabdominal exam to visualize the uterus, endometrium, and adnexa. Color and duplex Doppler ultrasound was utilized to evaluate blood flow to the ovaries.  COMPARISON:  None.  FINDINGS: Uterus  Measurements: 6.0 x 3.9 x 4.6 cm. No fibroids or other mass visualized.  Endometrium  Thickness: 10 mm.  No focal abnormality visualized.  Right ovary  Measurements: 1.9 x 1.2 x 2.3 cm. Normal appearance/no adnexal mass.  Left ovary  Measurements: 2.2 x 1.5 x 2.3 cm. Cystic lesion in the left ovary with low-level internal echoes and somewhat irregular borders measures 1.2 x 1.1 x 1.1 cm. No internal vascularity.  Pulsed Doppler evaluation of both ovaries demonstrates normal low-resistance arterial  and venous waveforms.  Other findings  Trace free fluid.  IMPRESSION: 1. No evidence of ovarian torsion. 2. 1.2 cm cystic lesion in the left ovary, possibly a corpus luteum. Consider follow-up ultrasound in 6 weeks to confirm resolution. 3. Unremarkable appearance of the uterus.   Electronically Signed   By: Sebastian AcheAllen  Grady   On: 03/13/2014 22:07     EKG Interpretation None      MDM   Final diagnoses:  Epiploic appendagitis    22F presents with acute onset L sided abdominal pain. Began while moving around on the couch, constant. Pain in L lower pelvis, nonradiating. No hx of kidney stones. No hx of ovarian cysts. Prior c-section and appendectomy. AFVSS here. Low LLQ pain, no L flank pain, no L CVA tenderness. Will perform pelvic, concern for ovarian torsion with constant pain, pain center low in LLQ. US negative for torsion. CT for stone - negative for stone, but positive for epiploic appendagitis. Will discharge home with pain meds.   Dagmar HaitWilliam Revella Shelton, MD 03/14/14 [phone]

## 2014-03-13 NOTE — ED Notes (Signed)
Pt aware urine sample is needed 

## 2014-03-14 MED ORDER — HYDROCODONE-ACETAMINOPHEN 5-325 MG PO TABS: 1.0000 | ORAL_TABLET | Freq: Four times a day (QID) | ORAL | Status: DC | PRN

## 2014-03-14 MED ORDER — IBUPROFEN 600 MG PO TABS: 600.0000 mg | ORAL_TABLET | Freq: Four times a day (QID) | ORAL | Status: DC | PRN

## 2014-03-18 ENCOUNTER — Telehealth (INDEPENDENT_AMBULATORY_CARE_PROVIDER_SITE_OTHER): Payer: Self-pay | Admitting: General Surgery

## 2014-03-18 ENCOUNTER — Telehealth (INDEPENDENT_AMBULATORY_CARE_PROVIDER_SITE_OTHER): Payer: Self-pay | Admitting: Surgery

## 2014-03-18 DIAGNOSIS — K562 Volvulus: Secondary | ICD-10-CM

## 2014-03-18 NOTE — Telephone Encounter (Signed)
Dr. Parke SimmersBland called with questions regarding treatment of torsion of appendix epiploica.  Patient with symptoms for 4 days.  Starting NSAID's and Ultram for pain.  If patient not improved, would like evaluated in our Urgent Office on Thursday or Friday of this week.  Please call patient on Friday morning.  If not improving, arrange to be seen in urgent office Friday afternoon.  Thank you,  Velora Hecklerodd M. Mykenzi Vanzile, MD, [facility] Office: [phone]

## 2014-03-18 NOTE — Telephone Encounter (Signed)
Called patient back and asked her to see how she is feeling and she stated that she is feeling better today, I asked the patient if she was running a fever, or N/V and she stated no. The patient stated that she has a follow up apt on Monday to see Dr Parke SimmersBland, I told patient if she start to run a fever or have N/V to call our office, and I also to her that I will give this message to Dr Gerrit FriendsGerkin nurse tomorrow,

## 2014-03-20 ENCOUNTER — Telehealth (INDEPENDENT_AMBULATORY_CARE_PROVIDER_SITE_OTHER): Payer: Self-pay

## 2014-03-20 NOTE — Telephone Encounter (Signed)
Per Dr Ardine EngGerkin's request I called Angelica Steele to ck on status. Angelica Steele states she is much better and has appt next week with Dr Parke SimmersBland. Angelica Steele states she will have Dr Parke SimmersBland call to make appt for her if Dr Parke SimmersBland wants Angelica Steele ref to GS.

## 2014-08-03 IMAGING — CT CT ABD-PELV W/O CM
1 series · 15 of 23 positions shown, 19 images · non-contrast
Comparison: None.

CLINICAL DATA: Left-sided flank pain.

EXAM:
CT ABDOMEN AND PELVIS WITHOUT CONTRAST
TECHNIQUE: Multidetector CT imaging of the abdomen and pelvis was performed
following the standard protocol without intravenous contrast.

[Series 6: lung · axial · 0.79mm/px · z∈[+804,+904]mm · 15 of 23 slices shown, 19 images]
[im 2/23  soft-tissue]
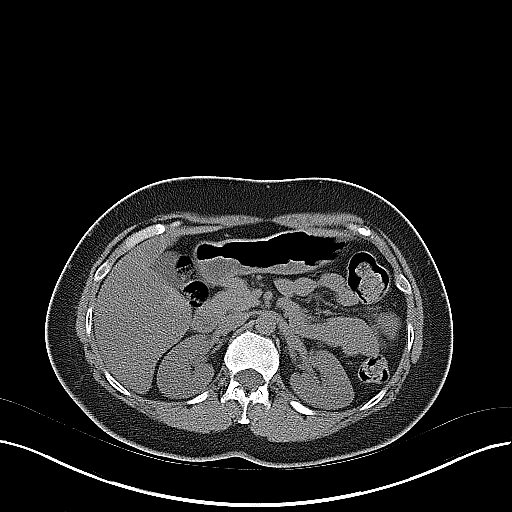
[im 2/23  bone]
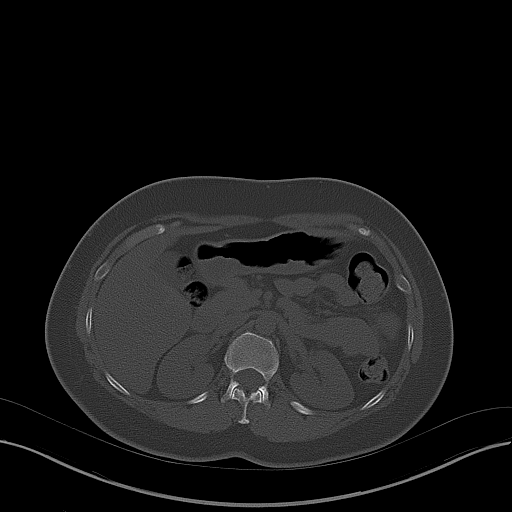
[im 4/23  soft-tissue]
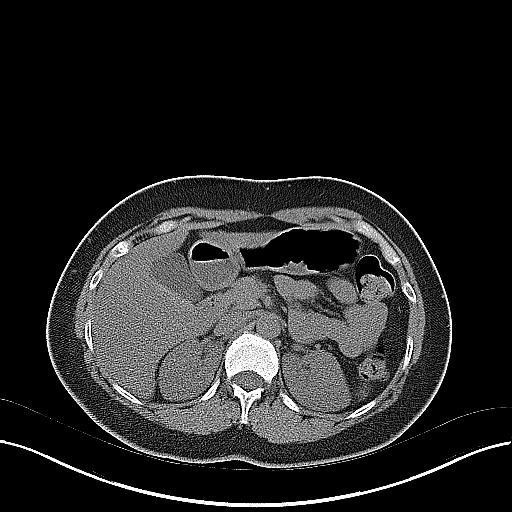
[im 6/23  soft-tissue]
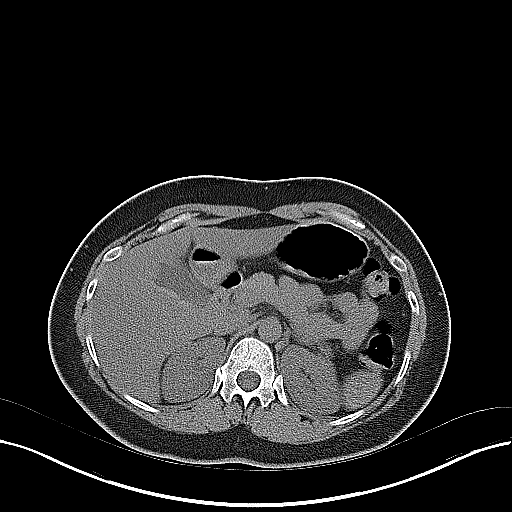
[im 7/23  soft-tissue]
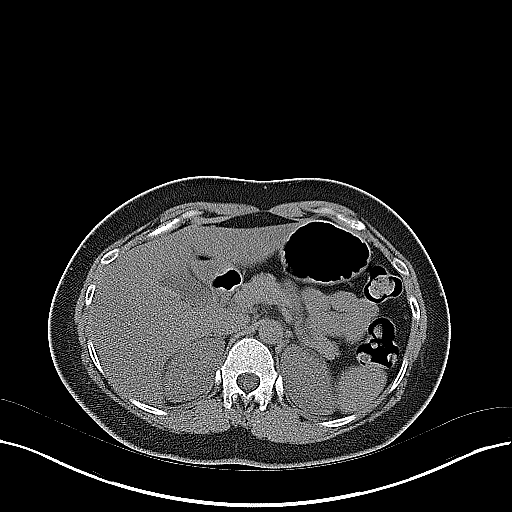
[im 9/23  soft-tissue]
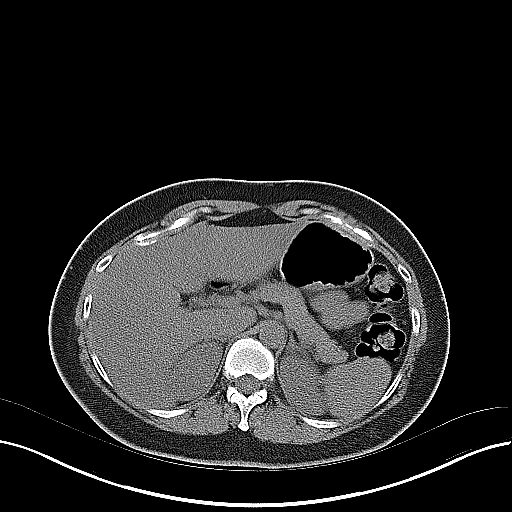
[im 10/23  soft-tissue]
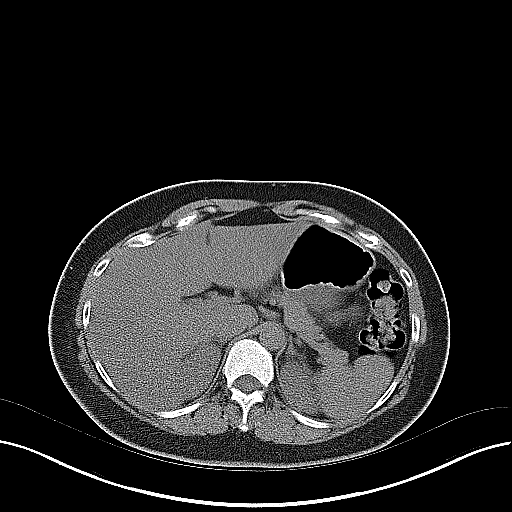
[im 12/23  soft-tissue]
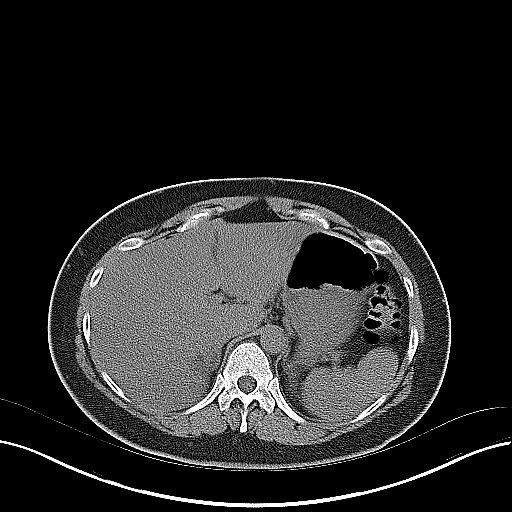
[im 14/23  soft-tissue]
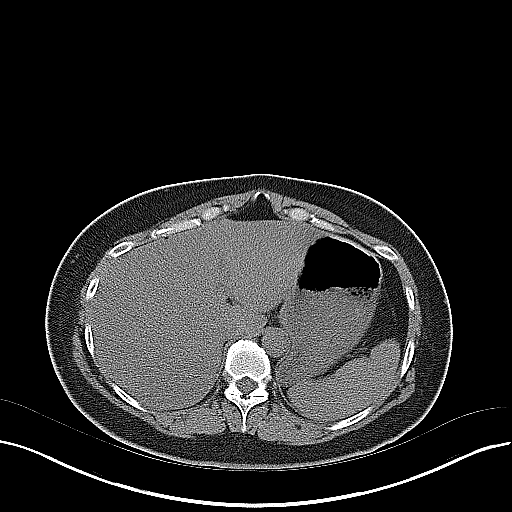
[im 15/23  soft-tissue]
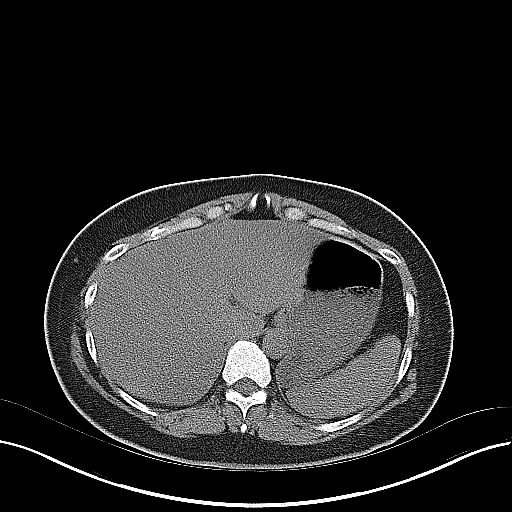
[im 15/23  bone]
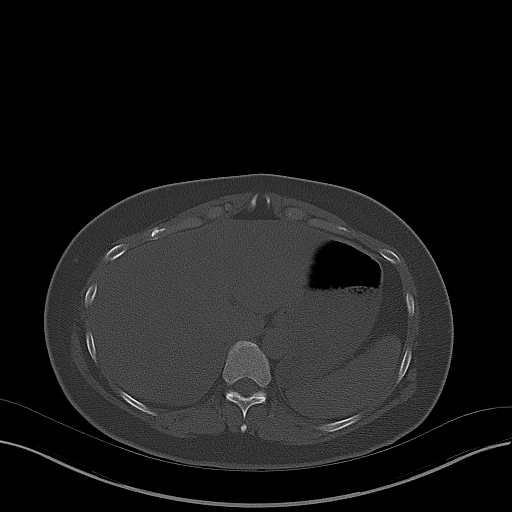
[im 17/23  soft-tissue]
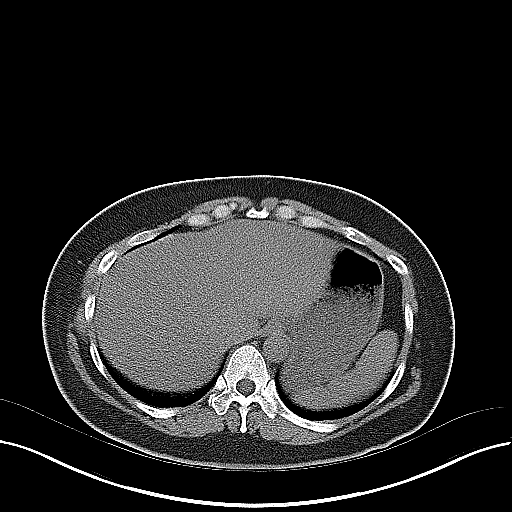
[im 18/23  soft-tissue]
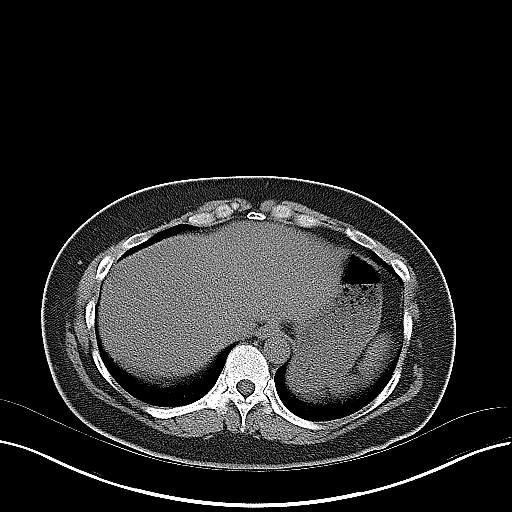
[im 19/23  lung]
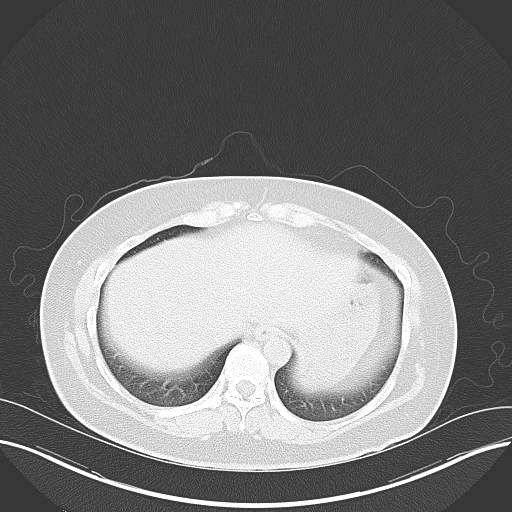
[im 20/23  soft-tissue]
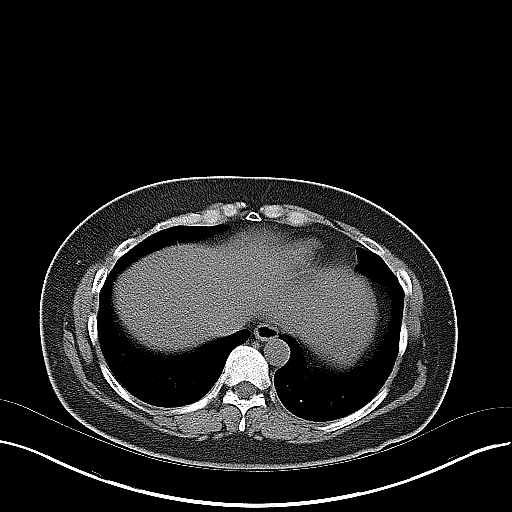
[im 20/23  lung]
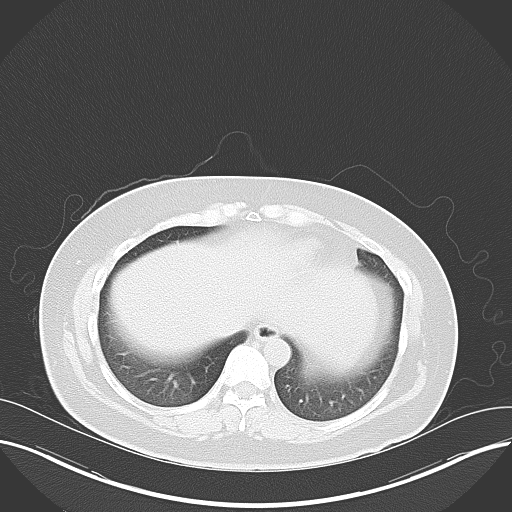
[im 21/23  lung]
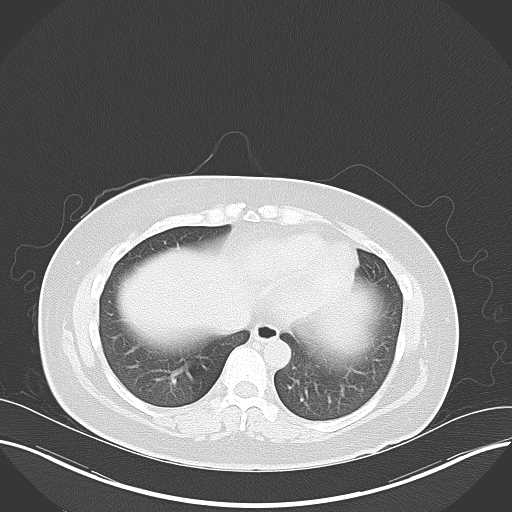
[im 22/23  soft-tissue]
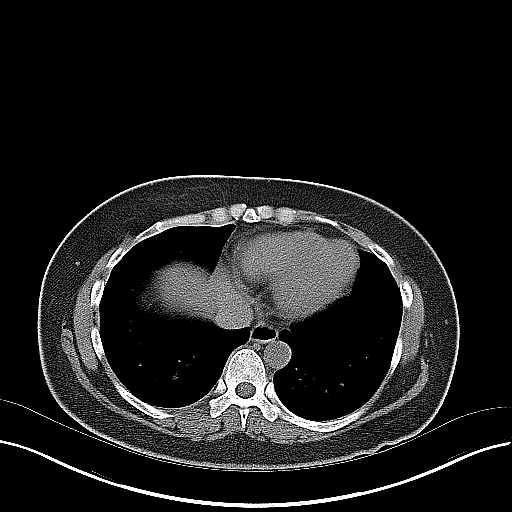
[im 22/23  lung]
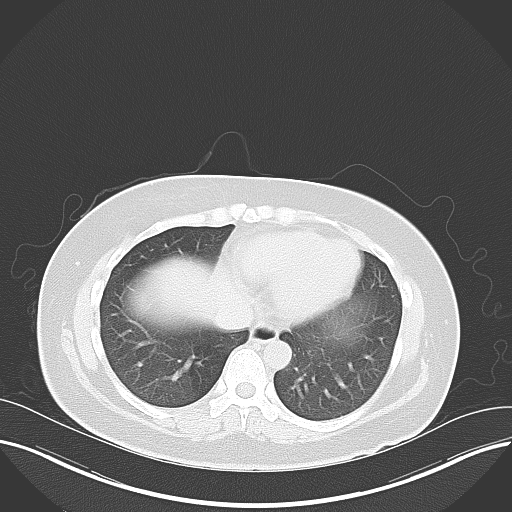

[15 of 23 positions shown; findings below may reference images not displayed]

FINDINGS: The visualized lung bases are clear.

There is diffuse fatty infiltration within the liver, with mild
sparing about the gallbladder fossa. The gallbladder is within
normal limits. The pancreas and adrenal glands are unremarkable.

The kidneys are unremarkable in appearance. There is no evidence of
hydronephrosis. No renal or ureteral stones are seen. No perinephric
stranding is appreciated.

No free fluid is identified. The small bowel is unremarkable in
appearance. The stomach is within normal limits. No acute vascular
abnormalities are seen.

Mild focal soft tissue inflammation is noted along the inferior left
paracolic gutter, adjacent to the descending colon. No associated
diverticulosis is seen, and the underlying colon appears intact.
Given the appearance on coronal images, this is thought most likely
to reflect epiploic appendagitis.

The patient is status post appendectomy. The colon is grossly
unremarkable in appearance.

The bladder is mildly distended; apparent anterior bladder wall
thickening is thought to reflect prior surgery, given overlying
surgical changes. No inguinal lymphadenopathy is seen.

No acute osseous abnormalities are identified.
IMPRESSION: 1. Mild focal soft tissue inflammation along the inferior left
paracolic gutter, adjacent to the descending colon. The appearance
is most compatible with epiploic appendagitis. The underlying colon
appears grossly intact.
2. No evidence of hydronephrosis; no obstructing ureteral stones
seen.
3. Diffuse fatty infiltration within the liver.

## 2014-09-21 ENCOUNTER — Encounter (HOSPITAL_COMMUNITY): Payer: Self-pay | Admitting: Emergency Medicine

## 2015-08-02 ENCOUNTER — Other Ambulatory Visit (HOSPITAL_COMMUNITY)
Admission: RE | Admit: 2015-08-02 | Discharge: 2015-08-02 | Disposition: A | Discharge status: Home / Self Care | Payer: Medicaid Other | Source: Ambulatory Visit | Attending: Family Medicine | Admitting: Family Medicine

## 2015-08-02 ENCOUNTER — Other Ambulatory Visit: Payer: Self-pay | Admitting: Family Medicine

## 2015-08-02 DIAGNOSIS — Z01419 Encounter for gynecological examination (general) (routine) without abnormal findings: Secondary | ICD-10-CM | POA: Diagnosis present

## 2015-08-02 DIAGNOSIS — Z1151 Encounter for screening for human papillomavirus (HPV): Secondary | ICD-10-CM | POA: Insufficient documentation

## 2015-08-04 LAB — CYTOLOGY - PAP

## 2016-01-18 ENCOUNTER — Ambulatory Visit: Payer: Medicaid Other

## 2016-01-21 ENCOUNTER — Encounter: Payer: Self-pay | Admitting: *Deleted

## 2016-01-26 ENCOUNTER — Ambulatory Visit: Payer: Medicaid Other | Attending: Orthopedic Surgery

## 2016-01-26 DIAGNOSIS — M25511 Pain in right shoulder: Secondary | ICD-10-CM | POA: Insufficient documentation

## 2016-01-26 DIAGNOSIS — R6889 Other general symptoms and signs: Secondary | ICD-10-CM

## 2016-01-26 NOTE — Patient Instructions (Signed)
From cabinet issued isometrics 1-2x/day 5-15 reps 5-10 sec hold  And rockwood for future with bands. Asked to ice 2-3x/day

## 2016-01-26 NOTE — Therapy (Signed)
[facility] [address] [place], [place], [address] Phone: [phone]   Fax:  [phone]  Physical Therapy Evaluation  Patient Details  Name: Angelica Steele MRN: [id number] Date of Birth: [birth year] Referring Provider: Salvatore Marvelobert Wainer, MD  Encounter Date: 01/26/2016      PT End of Session - 01/26/16 1013    Visit Number 1   Number of Visits 1   PT Start Time 0930   PT Stop Time 1010   PT Time Calculation (min) 40 min   Activity Tolerance Patient tolerated treatment well   Behavior [facility] tasks assessed/performed      Past Medical History  Diagnosis Date  . Anemia   . Diabetes mellitus Gestional Diet Controlled    Past Surgical History  Procedure Laterality Date  . Cesarean section    . Appendectomy  5517yrs ago  . Cesarean section  06/05/2011    Procedure: CESAREAN SECTION;  Surgeon: Fortino SicEleanor E Greene, MD;  Location: [facility];  Service: Gynecology;  Laterality: N/A;    There were no vitals filed for this visit.  Visit Diagnosis:  Shoulder pain, right - Plan: PT plan of care cert/re-cert  Activity intolerance - Plan: PT plan of care cert/re-cert      Subjective Assessment - 01/26/16 0942    Subjective She reports pain onset without injury. She has been lifting 42 year old daughter . ( 32 pounds)   Pertinent History None   Limitations Lifting  eases amount of tasks at home   How long can you sit comfortably? As needed   How long can you stand comfortably? as needed   How long can you walk comfortably? as needed   Diagnostic tests inflammation noted on xray   Currently in Pain? Yes   Pain Score 3    Pain Location Shoulder   Pain Orientation Right   Pain Descriptors / Indicators Aching   Pain Type Acute pain   Pain Onset 1 to 4 weeks ago   Pain Frequency Intermittent   Aggravating Factors  using Rt arn   Pain Relieving Factors medication, ice    Multiple Pain Sites No            [facility] PT  Assessment - 01/26/16 0940    Assessment   Medical Diagnosis RT shoulder impingement   Referring Provider Salvatore Marvelobert Wainer, MD   Onset Date/Surgical Date --  > 28month   Hand Dominance Left   Next MD Visit 2 weeks   Prior Therapy No   Precautions   Precautions None   Restrictions   Weight Bearing Restrictions No   Balance Screen   Has the patient fallen in the past 6 months No   Has the patient had a decrease in activity level because of a fear of falling?  No   Is the patient reluctant to leave their home because of a fear of falling?  No   Prior Function   Level of Independence Independent   Cognition   Overall Cognitive Status Within Functional Limits for tasks assessed   ROM / Strength   AROM / PROM / Strength AROM;PROM;Strength   AROM   AROM Assessment Site Shoulder   Right/Left Shoulder Right;Left   Right Shoulder Extension 60 Degrees   Right Shoulder Flexion 157 Degrees   Right Shoulder ABduction 162 Degrees   Right Shoulder Internal Rotation 54 Degrees   Right Shoulder External Rotation 90 Degrees   Right Shoulder Horizontal ABduction 23 Degrees  Right Shoulder Horizontal  ADduction 105 Degrees   Left Shoulder Extension 60 Degrees   Left Shoulder Flexion 156 Degrees   Left Shoulder ABduction 158 Degrees   Left Shoulder Internal Rotation 68 Degrees   Left Shoulder External Rotation 90 Degrees   Left Shoulder Horizontal ABduction 28 Degrees   Left Shoulder Horizontal ADduction 105 Degrees   Strength   Overall Strength Comments Normal RT and Lt shoulder strength   Palpation   Palpation comment Tender inf to acromium   Ambulation/Gait   Gait Comments Normal                   [facility] Adult PT Treatment/Exercise - 01/26/16 0940    Exercises   Exercises Shoulder   Shoulder Exercises: Seated   Other Seated Exercises issued isometrics and rockwood for home Rockwood only to start if pain has eased signifiantly. Yellow and red band issued   Manual Therapy    Manual Therapy Taping   Kinesiotex Create Space   Kinesiotix   Create Space [facility] joint taping one strip post to ant with 75% pull issued 2 peices for home and cautioned to remove if irritating and keep on and can shower in tape for 3-4 days                PT Education - 01/26/16 1013    Education provided Yes   Education Details Medicaid limits , HEP    Person(s) Educated Patient   Methods Explanation;Demonstration;Tactile cues;Verbal cues;Handout   Comprehension Returned demonstration;Verbalized understanding                    Plan - 01/26/16 1013    Clinical Impression Statement [name] presents with RT shoulder pain with normal strength and ROm. She has mildly rounded shoulder . It appears due to lack of a hand she may be positioning shoulder IR  to lift and hold child and this may contribute to irritation.  She should improve with icing and more awareness of use of RT arm and rest in good position   PT Frequency One time visit   PT Treatment/Interventions Patient/family education;Therapeutic exercise   PT Next Visit Plan None, Eval only   Consulted and Agree with Plan of Care Patient         Problem List Patient Active Problem List   Diagnosis Date Noted  . 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 Anemia 06/05/2011  . Advanced maternal age 03/06/2011  . Oligohydramnios antepartum 06/05/2011  . Previous cesarean delivery affecting pregnancy 06/05/2011  . Gestational diabetes, diet controlled 06/05/2011    Caprice Red PT 01/26/2016, 10:19 AM  [facility] [address] [place], [place], [address] Phone: [phone]   Fax:  [phone]  Name: Angelica Steele MRN: [id number] Date of Birth: 24-Sep-1976

## 2016-02-18 ENCOUNTER — Encounter: Payer: Medicaid Other | Admitting: Obstetrics & Gynecology

## 2023-10-09 ENCOUNTER — Emergency Department (HOSPITAL_BASED_OUTPATIENT_CLINIC_OR_DEPARTMENT_OTHER): Payer: Self-pay

## 2023-10-09 ENCOUNTER — Other Ambulatory Visit: Payer: Self-pay

## 2023-10-09 ENCOUNTER — Encounter (HOSPITAL_BASED_OUTPATIENT_CLINIC_OR_DEPARTMENT_OTHER): Payer: Self-pay

## 2023-10-09 ENCOUNTER — Inpatient Hospital Stay (HOSPITAL_BASED_OUTPATIENT_CLINIC_OR_DEPARTMENT_OTHER)
Admission: EM | Admit: 2023-10-09 | Discharge: 2023-10-13 | DRG: 871 | Disposition: A | Discharge status: Home / Self Care | Payer: Self-pay | Attending: Internal Medicine | Admitting: Internal Medicine

## 2023-10-09 ENCOUNTER — Emergency Department (HOSPITAL_BASED_OUTPATIENT_CLINIC_OR_DEPARTMENT_OTHER): Payer: Self-pay | Admitting: Radiology

## 2023-10-09 DIAGNOSIS — J189 Pneumonia, unspecified organism: Secondary | ICD-10-CM | POA: Diagnosis present

## 2023-10-09 DIAGNOSIS — Z7984 Long term (current) use of oral hypoglycemic drugs: Secondary | ICD-10-CM

## 2023-10-09 DIAGNOSIS — E872 Acidosis, unspecified: Secondary | ICD-10-CM | POA: Diagnosis present

## 2023-10-09 DIAGNOSIS — A419 Sepsis, unspecified organism: Principal | ICD-10-CM | POA: Diagnosis present

## 2023-10-09 DIAGNOSIS — K529 Noninfective gastroenteritis and colitis, unspecified: Secondary | ICD-10-CM | POA: Diagnosis present

## 2023-10-09 DIAGNOSIS — K760 Fatty (change of) liver, not elsewhere classified: Secondary | ICD-10-CM | POA: Diagnosis present

## 2023-10-09 DIAGNOSIS — D259 Leiomyoma of uterus, unspecified: Secondary | ICD-10-CM | POA: Diagnosis present

## 2023-10-09 DIAGNOSIS — I10 Essential (primary) hypertension: Secondary | ICD-10-CM | POA: Diagnosis present

## 2023-10-09 DIAGNOSIS — E1165 Type 2 diabetes mellitus with hyperglycemia: Secondary | ICD-10-CM | POA: Diagnosis present

## 2023-10-09 DIAGNOSIS — Z1152 Encounter for screening for COVID-19: Secondary | ICD-10-CM

## 2023-10-09 DIAGNOSIS — N179 Acute kidney failure, unspecified: Principal | ICD-10-CM | POA: Diagnosis present

## 2023-10-09 DIAGNOSIS — R6520 Severe sepsis without septic shock: Secondary | ICD-10-CM | POA: Diagnosis present

## 2023-10-09 DIAGNOSIS — Z8632 Personal history of gestational diabetes: Secondary | ICD-10-CM

## 2023-10-09 DIAGNOSIS — E871 Hypo-osmolality and hyponatremia: Secondary | ICD-10-CM | POA: Diagnosis present

## 2023-10-09 DIAGNOSIS — E873 Alkalosis: Secondary | ICD-10-CM | POA: Diagnosis present

## 2023-10-09 DIAGNOSIS — R739 Hyperglycemia, unspecified: Secondary | ICD-10-CM

## 2023-10-09 DIAGNOSIS — Z79899 Other long term (current) drug therapy: Secondary | ICD-10-CM

## 2023-10-09 LAB — I-STAT VENOUS BLOOD GAS, ED
Acid-base deficit: 1 mmol/L (ref 0.0–2.0)
Bicarbonate: 22.6 mmol/L (ref 20.0–28.0)
Calcium, Ion: 1.18 mmol/L (ref 1.15–1.40)
HCT: 35 % — ABNORMAL LOW (ref 36.0–46.0)
Hemoglobin: 11.9 g/dL — ABNORMAL LOW (ref 12.0–15.0)
O2 Saturation: 85 %
Potassium: 3.5 mmol/L (ref 3.5–5.1)
Sodium: 130 mmol/L — ABNORMAL LOW (ref 135–145)
TCO2: 24 mmol/L (ref 22–32)
pCO2, Ven: 33.6 mm[Hg] — ABNORMAL LOW (ref 44–60)
pH, Ven: 7.436 — ABNORMAL HIGH (ref 7.25–7.43)
pO2, Ven: 48 mm[Hg] — ABNORMAL HIGH (ref 32–45)

## 2023-10-09 LAB — COMPREHENSIVE METABOLIC PANEL
ALT: 148 U/L — ABNORMAL HIGH (ref 0–44)
AST: 68 U/L — ABNORMAL HIGH (ref 15–41)
Albumin: 3.4 g/dL — ABNORMAL LOW (ref 3.5–5.0)
Alkaline Phosphatase: 122 U/L (ref 38–126)
Anion gap: 14 (ref 5–15)
BUN: 32 mg/dL — ABNORMAL HIGH (ref 6–20)
CO2: 23 mmol/L (ref 22–32)
Calcium: 10 mg/dL (ref 8.9–10.3)
Chloride: 92 mmol/L — ABNORMAL LOW (ref 98–111)
Creatinine, Ser: 1.26 mg/dL — ABNORMAL HIGH (ref 0.44–1.00)
GFR, Estimated: 52 mL/min — ABNORMAL LOW (ref >60)
Glucose, Bld: 286 mg/dL — ABNORMAL HIGH (ref 70–99)
Potassium: 3.6 mmol/L (ref 3.5–5.1)
Sodium: 129 mmol/L — ABNORMAL LOW (ref 135–145)
Total Bilirubin: 1.9 mg/dL — ABNORMAL HIGH (ref <1.2)
Total Protein: 7.4 g/dL (ref 6.5–8.1)

## 2023-10-09 LAB — CBC WITH DIFFERENTIAL/PLATELET
Abs Immature Granulocytes: 0.22 10*3/uL — ABNORMAL HIGH (ref 0.00–0.07)
Basophils Absolute: 0 10*3/uL (ref 0.0–0.1)
Basophils Relative: 0 %
Eosinophils Absolute: 0 10*3/uL (ref 0.0–0.5)
Eosinophils Relative: 0 %
HCT: 41.8 % (ref 36.0–46.0)
Hemoglobin: 14.2 g/dL (ref 12.0–15.0)
Immature Granulocytes: 1 %
Lymphocytes Relative: 5 %
Lymphs Abs: 1.4 10*3/uL (ref 0.7–4.0)
MCH: 29.4 pg (ref 26.0–34.0)
MCHC: 34 g/dL (ref 30.0–36.0)
MCV: 86.5 fL (ref 80.0–100.0)
Monocytes Absolute: 0.8 10*3/uL (ref 0.1–1.0)
Monocytes Relative: 3 %
Neutro Abs: 27.4 10*3/uL — ABNORMAL HIGH (ref 1.7–7.7)
Neutrophils Relative %: 91 %
Platelets: 266 10*3/uL (ref 150–400)
RBC: 4.83 MIL/uL (ref 3.87–5.11)
RDW: 14.4 % (ref 11.5–15.5)
WBC: 29.9 10*3/uL — ABNORMAL HIGH (ref 4.0–10.5)
nRBC: 0 % (ref 0.0–0.2)

## 2023-10-09 LAB — URINALYSIS, W/ REFLEX TO CULTURE (INFECTION SUSPECTED)
Bilirubin Urine: NEGATIVE
Glucose, UA: 100 mg/dL — AB
Hgb urine dipstick: NEGATIVE
Ketones, ur: NEGATIVE mg/dL
Leukocytes,Ua: NEGATIVE
Nitrite: NEGATIVE
Protein, ur: 100 mg/dL — AB
Specific Gravity, Urine: 1.026 (ref 1.005–1.030)
pH: 5.5 (ref 5.0–8.0)

## 2023-10-09 LAB — LACTIC ACID, PLASMA
Lactic Acid, Venous: 3.3 mmol/L (ref 0.5–1.9)
Lactic Acid, Venous: 4.6 mmol/L (ref 0.5–1.9)
Lactic Acid, Venous: 4.2 mmol/L (ref 0.5–1.9)

## 2023-10-09 LAB — BETA-HYDROXYBUTYRIC ACID: Beta-Hydroxybutyric Acid: 0.1 mmol/L (ref 0.05–0.27)

## 2023-10-09 LAB — PREGNANCY, URINE: Preg Test, Ur: NEGATIVE

## 2023-10-09 LAB — PROTIME-INR
INR: 1.4 — ABNORMAL HIGH (ref 0.8–1.2)
Prothrombin Time: 17 s — ABNORMAL HIGH (ref 11.4–15.2)

## 2023-10-09 LAB — RESP PANEL BY RT-PCR (RSV, FLU A&B, COVID)  RVPGX2
Influenza A by PCR: NEGATIVE
Influenza B by PCR: NEGATIVE
Resp Syncytial Virus by PCR: NEGATIVE
SARS Coronavirus 2 by RT PCR: NEGATIVE

## 2023-10-09 MED ORDER — ALBUTEROL SULFATE (2.5 MG/3ML) 0.083% IN NEBU
2.5000 mg | INHALATION_SOLUTION | Freq: Once | RESPIRATORY_TRACT | Status: AC
Start: 2023-10-09 — End: 2023-10-09
  Administered 2023-10-09: 2.5 mg via RESPIRATORY_TRACT

## 2023-10-09 MED ORDER — SODIUM CHLORIDE 0.9 % IV SOLN
500.0000 mg | INTRAVENOUS | Status: DC
  Administered 2023-10-09 – 2023-10-10 (×2): 500 mg via INTRAVENOUS
  Filled 2023-10-09 (×2): qty 5

## 2023-10-09 MED ORDER — LACTATED RINGERS IV BOLUS
1000.0000 mL | Freq: Once | INTRAVENOUS | Status: AC
  Administered 2023-10-09: 1000 mL via INTRAVENOUS

## 2023-10-09 MED ORDER — IOHEXOL 300 MG/ML  SOLN
100.0000 mL | Freq: Once | INTRAMUSCULAR | Status: AC | PRN
  Administered 2023-10-09: 85 mL via INTRAVENOUS

## 2023-10-09 MED ORDER — LACTATED RINGERS IV BOLUS (SEPSIS)
1000.0000 mL | Freq: Once | INTRAVENOUS | Status: AC
  Administered 2023-10-09: 1000 mL via INTRAVENOUS

## 2023-10-09 MED ORDER — ACETAMINOPHEN 160 MG/5ML PO SOLN
650.0000 mg | Freq: Once | ORAL | Status: AC
  Administered 2023-10-09: 650 mg via ORAL
  Filled 2023-10-09: qty 20.3

## 2023-10-09 MED ORDER — ALBUTEROL SULFATE (2.5 MG/3ML) 0.083% IN NEBU
INHALATION_SOLUTION | RESPIRATORY_TRACT | Status: AC
  Filled 2023-10-09: qty 3

## 2023-10-09 MED ORDER — FENTANYL CITRATE PF 50 MCG/ML IJ SOSY
50.0000 ug | PREFILLED_SYRINGE | Freq: Once | INTRAMUSCULAR | Status: AC
  Administered 2023-10-09: 50 ug via INTRAVENOUS
  Filled 2023-10-09: qty 1

## 2023-10-09 MED ORDER — ONDANSETRON HCL 4 MG/2ML IJ SOLN
4.0000 mg | Freq: Once | INTRAMUSCULAR | Status: AC
  Administered 2023-10-09: 4 mg via INTRAVENOUS
  Filled 2023-10-09: qty 2

## 2023-10-09 MED ORDER — ACETAMINOPHEN 325 MG PO TABS
650.0000 mg | ORAL_TABLET | Freq: Once | ORAL | Status: DC | PRN
  Filled 2023-10-09: qty 2

## 2023-10-09 MED ORDER — IBUPROFEN 100 MG/5ML PO SUSP
600.0000 mg | Freq: Once | ORAL | Status: AC
  Administered 2023-10-09: 600 mg via ORAL
  Filled 2023-10-09: qty 30

## 2023-10-09 MED ORDER — FENTANYL CITRATE PF 50 MCG/ML IJ SOSY
PREFILLED_SYRINGE | INTRAMUSCULAR | Status: AC
  Filled 2023-10-09: qty 1

## 2023-10-09 MED ORDER — FENTANYL CITRATE PF 50 MCG/ML IJ SOSY
50.0000 ug | PREFILLED_SYRINGE | Freq: Once | INTRAMUSCULAR | Status: AC
  Administered 2023-10-09: 50 ug via INTRAVENOUS

## 2023-10-09 MED ORDER — SODIUM CHLORIDE 0.9 % IV SOLN
2.0000 g | INTRAVENOUS | Status: DC
  Administered 2023-10-09 – 2023-10-12 (×4): 2 g via INTRAVENOUS
  Filled 2023-10-09 (×4): qty 20

## 2023-10-09 NOTE — ED Notes (Addendum)
Blood cultures x2 were drawn before starting antibiotics.

## 2023-10-09 NOTE — Sepsis Progress Note (Signed)
Asking nurse if blood cultures drawn before antibiotics hung (BC drawn @ 1835 and AB hung 1823)

## 2023-10-09 NOTE — ED Notes (Signed)
Patient transported to CT 

## 2023-10-09 NOTE — Sepsis Progress Note (Signed)
Elink monitoring for the code sepsis protocol.  

## 2023-10-09 NOTE — ED Triage Notes (Signed)
Pt POV from home reporting n/v/d since Saturday. Also reporting fevers and dry cough, highest 104, last tylenol 10:30

## 2023-10-09 NOTE — Sepsis Progress Note (Signed)
Notified bedside nurse of need to draw blood cultures.  

## 2023-10-09 NOTE — ED Provider Notes (Signed)
[place] EMERGENCY DEPARTMENT AT [facility] Provider Note   CSN: [id number] Arrival date & time: 10/09/23  1718     History  Chief Complaint  Patient presents with   Emesis   Diarrhea    Angelica Steele is a 47 y.o. female.  Pt is a 47 yo female with pmhx significant for anemia, and gestational dm.  Pt said she's had fever and cough for 4 days.  She has had n/v/d as well.  She has been unable to keep anything down.  She has tried to take tylenol, but she vomits it up.  Highest fever was 104.       Home Medications Prior to Admission medications   Medication Sig Start Date End Date Taking? Authorizing Provider  ALPRAZolam Prudy Feeler) 0.5 MG tablet Take 0.5 mg by mouth at bedtime as needed for anxiety (anxiety). Reported on 01/26/2016    [provider]  HYDROcodone-acetaminophen (NORCO/VICODIN) 5-325 MG per tablet Take 1-2 tablets by mouth every 6 (six) hours as needed for moderate pain. Patient not taking: Reported on 01/26/2016 03/14/14   Elwin Mocha, MD  ibuprofen (ADVIL,MOTRIN) 600 MG tablet Take 1 tablet (600 mg total) by mouth every 6 (six) hours as needed. 03/14/14   Elwin Mocha, MD  Multiple Vitamins-Minerals (MULTIVITAMIN WITH MINERALS) tablet Take 1 tablet by mouth daily.    [provider]  traMADol (ULTRAM) 50 MG tablet Take by mouth every 6 (six) hours as needed.    [provider]  vitamin B-12 (CYANOCOBALAMIN) 100 MCG tablet Take 100 mcg by mouth daily. Reported on 01/26/2016    [provider]      Allergies    Patient has no known allergies.    Review of Systems   Review of Systems  Constitutional:  Positive for fever.  Respiratory:  Positive for cough.   Gastrointestinal:  Positive for diarrhea, nausea and vomiting.  All other systems reviewed and are negative.   Physical Exam Updated Vital Signs BP (!) 143/132   Pulse (!) 115   Temp 98.6 F (37 C) (Oral)   Resp (!) 27   SpO2 95%  Physical Exam Vitals and  nursing note reviewed.  Constitutional:      Appearance: Normal appearance. She is ill-appearing.  HENT:     Head: Normocephalic and atraumatic.     Right Ear: External ear normal.     Left Ear: External ear normal.     Nose: Nose normal.     Mouth/Throat:     Mouth: Mucous membranes are dry.  Eyes:     Extraocular Movements: Extraocular movements intact.     Conjunctiva/sclera: Conjunctivae normal.     Pupils: Pupils are equal, round, and reactive to light.  Cardiovascular:     Rate and Rhythm: Regular rhythm. Tachycardia present.     Pulses: Normal pulses.     Heart sounds: Normal heart sounds.  Pulmonary:     Effort: Tachypnea present.     Breath sounds: Normal breath sounds.  Abdominal:     General: Abdomen is flat. Bowel sounds are normal.     Palpations: Abdomen is soft.  Musculoskeletal:        General: Normal range of motion.     Cervical back: Normal range of motion.     Comments: Congenital absence right hand  Skin:    General: Skin is warm.     Capillary Refill: Capillary refill takes less than 2 seconds.  Neurological:     General: No focal  deficit present.     Mental Status: She is alert and oriented to person, place, and time.  Psychiatric:        Mood and Affect: Mood normal.        Behavior: Behavior normal.     ED Results / Procedures / Treatments   Labs (all labs ordered are listed, but only abnormal results are displayed) Labs Reviewed  LACTIC ACID, PLASMA - Abnormal; Notable for the following components:      Result Value   Lactic Acid, Venous 3.3 (*)    All other components within normal limits  LACTIC ACID, PLASMA - Abnormal; Notable for the following components:   Lactic Acid, Venous 4.6 (*)    All other components within normal limits  CBC WITH DIFFERENTIAL/PLATELET - Abnormal; Notable for the following components:   WBC 29.9 (*)    Neutro Abs 27.4 (*)    Abs Immature Granulocytes 0.22 (*)    All other components within normal limits   URINALYSIS, W/ REFLEX TO CULTURE (INFECTION SUSPECTED) - Abnormal; Notable for the following components:   APPearance HAZY (*)    Glucose, UA 100 (*)    Protein, ur 100 (*)    Bacteria, UA RARE (*)    All other components within normal limits  PROTIME-INR - Abnormal; Notable for the following components:   Prothrombin Time 17.0 (*)    INR 1.4 (*)    All other components within normal limits  COMPREHENSIVE METABOLIC PANEL - Abnormal; Notable for the following components:   Sodium 129 (*)    Chloride 92 (*)    Glucose, Bld 286 (*)    BUN 32 (*)    Creatinine, Ser 1.26 (*)    Albumin 3.4 (*)    AST 68 (*)    ALT 148 (*)    Total Bilirubin 1.9 (*)    GFR, Estimated 52 (*)    All other components within normal limits  I-STAT VENOUS BLOOD GAS, ED - Abnormal; Notable for the following components:   pH, Ven 7.436 (*)    pCO2, Ven 33.6 (*)    pO2, Ven 48 (*)    Sodium 130 (*)    HCT 35.0 (*)    Hemoglobin 11.9 (*)    All other components within normal limits  RESP PANEL BY RT-PCR (RSV, FLU A&B, COVID)  RVPGX2  CULTURE, BLOOD (ROUTINE X 2)  CULTURE, BLOOD (ROUTINE X 2)  PREGNANCY, URINE  BETA-HYDROXYBUTYRIC ACID  LACTIC ACID, PLASMA  LACTIC ACID, PLASMA    EKG None  Radiology CT CHEST ABDOMEN PELVIS W CONTRAST  Result Date: 10/09/2023 CLINICAL DATA:  Sepsis. Nausea, vomiting, and diarrhea. Fever and dry cough. EXAM: CT CHEST, ABDOMEN, AND PELVIS WITH CONTRAST TECHNIQUE: Multidetector CT imaging of the chest, abdomen and pelvis was performed following the standard protocol during bolus administration of intravenous contrast. RADIATION DOSE REDUCTION: This exam was performed according to the departmental dose-optimization program which includes automated exposure control, adjustment of the mA and/or kV according to patient size and/or use of iterative reconstruction technique. CONTRAST:  85mL OMNIPAQUE IOHEXOL 300 MG/ML  SOLN COMPARISON:  03/13/2014. FINDINGS: CT CHEST FINDINGS  Cardiovascular: The heart is enlarged and there is no pericardial effusion. The aorta and pulmonary trunk are normal in caliber. Mediastinum/Nodes: No mediastinal, hilar, or axillary lymphadenopathy. The thyroid gland, trachea, and esophagus are within normal limits. Lungs/Pleura: Scattered airspace opacities are noted in the perihilar region in the left upper lobe. There is consolidation with a associated airspace disease  in the left lower lobe. No effusion or pneumothorax is seen. Musculoskeletal: Degenerative changes are present in the thoracic spine. No acute osseous abnormality. CT ABDOMEN PELVIS FINDINGS Hepatobiliary: No focal liver abnormality is seen. Fatty infiltration of the liver is noted. No gallstones, gallbladder wall thickening, or biliary dilatation. Pancreas: Unremarkable. No pancreatic ductal dilatation or surrounding inflammatory changes. Spleen: Normal in size without focal abnormality. Adrenals/Urinary Tract: The adrenal glands are within normal limits. The kidneys enhance symmetrically. No renal calculus or hydronephrosis bilaterally. The bladder is nondistended. Stomach/Bowel: Stomach is within normal limits. Appendix is surgically absent. No evidence of bowel wall thickening, distention, or inflammatory changes. No free air or pneumatosis. Vascular/Lymphatic: Aortic atherosclerosis. No enlarged abdominal or pelvic lymph nodes. Reproductive: A uterine fibroid is noted on the right. No adnexal mass. Other: No abdominopelvic ascites. Musculoskeletal: Degenerative changes are present in the lumbar spine. No acute osseous abnormality. IMPRESSION: 1. Right lower lobe consolidation with scattered airspace disease in the right lower lobe and left upper lobe, consistent with multifocal pneumonia. 2. No acute process in the abdomen and pelvis 3. Hepatic steatosis. 4. Uterine fibroid. 5. Aortic atherosclerosis. Electronically Signed   By: Thornell Sartorius M.D.   On: 10/09/2023 21:36   DG Chest Port 1  View  Result Date: 10/09/2023 CLINICAL DATA:  Possible sepsis. Nausea, vomiting, and diarrhea. Fever and dry cough. EXAM: PORTABLE CHEST 1 VIEW COMPARISON:  None Available. FINDINGS: The heart is enlarged and mediastinal contours are within normal limits. Focal consolidation is noted in the infrahilar region on the right. Mild airspace disease is noted in the perihilar region on the left. No effusion or pneumothorax bilaterally. No acute osseous abnormality. IMPRESSION: 1. Focal consolidation in the infrahilar region on the right and mild perihilar airspace disease on the left, suggesting multifocal pneumonia. 2. Cardiomegaly. Electronically Signed   By: Thornell Sartorius M.D.   On: 10/09/2023 21:27    Procedures Procedures    Medications Ordered in ED Medications  cefTRIAXone (ROCEPHIN) 2 g in sodium chloride 0.9 % 100 mL IVPB (0 g Intravenous Stopped 10/09/23 1907)  azithromycin (ZITHROMAX) 500 mg in sodium chloride 0.9 % 250 mL IVPB (0 mg Intravenous Stopped 10/09/23 2034)  fentaNYL (SUBLIMAZE) injection 50 mcg (has no administration in time range)  acetaminophen (TYLENOL) 160 MG/5ML solution 650 mg (650 mg Oral Given 10/09/23 1759)  lactated ringers bolus 1,000 mL (0 mLs Intravenous Stopped 10/09/23 1945)    And  lactated ringers bolus 1,000 mL (0 mLs Intravenous Stopped 10/09/23 2034)  ondansetron (ZOFRAN) injection 4 mg (4 mg Intravenous Given 10/09/23 1808)  fentaNYL (SUBLIMAZE) injection 50 mcg (50 mcg Intravenous Given 10/09/23 1809)  ibuprofen (ADVIL) 100 MG/5ML suspension 600 mg (600 mg Oral Given 10/09/23 1809)  iohexol (OMNIPAQUE) 300 MG/ML solution 100 mL (85 mLs Intravenous Contrast Given 10/09/23 2033)  lactated ringers bolus 1,000 mL (0 mLs Intravenous Stopped 10/09/23 2154)  albuterol (PROVENTIL) (2.5 MG/3ML) 0.083% nebulizer solution 2.5 mg (2.5 mg Nebulization Given 10/09/23 2207)    ED Course/ Medical Decision Making/ A&P                                 Medical Decision  Making Amount and/or Complexity of Data Reviewed Labs: ordered. Radiology: ordered. ECG/medicine tests: ordered.  Risk OTC drugs. Prescription drug management. Decision regarding hospitalization.   This patient presents to the ED for concern of cough/fever, this involves an extensive number of treatment options, and  is a complaint that carries with it a high risk of complications and morbidity.  The differential diagnosis includes sepsis, pna, covid/flu/rsv   Co morbidities that complicate the patient evaluation  Anemia and gestational dm   Additional history obtained:  Additional history obtained from epic chart review External records from outside source obtained and reviewed including husband   Lab Tests:  I Ordered, and personally interpreted labs.  The pertinent results include:  cbc with wbc elevated at 29.9, cmp with na low at 129, glucose elevated at 286, bun elevated at 32, cr elevated at 1.26, ast elevated at 68 and alt elevated at 148, tb elevated at 1.9; covid/flu/rsv neg; 2nd lactic up to 4.6   Imaging Studies ordered:  I ordered imaging studies including cxr, ct chest/abd/pelvis  I independently visualized and interpreted imaging which showed  CXR: Focal consolidation in the infrahilar region on the right and  mild perihilar airspace disease on the left, suggesting multifocal  pneumonia.  2. Cardiomegaly.  CT chest/abd/pelvis: Right lower lobe consolidation with scattered airspace disease in  the right lower lobe and left upper lobe, consistent with multifocal  pneumonia.  2. No acute process in the abdomen and pelvis  3. Hepatic steatosis.  4. Uterine fibroid.  5. Aortic atherosclerosis.      I agree with the radiologist interpretation   Cardiac Monitoring:  The patient was maintained on a cardiac monitor.  I personally viewed and interpreted the cardiac monitored which showed an underlying rhythm of: st   Medicines ordered and prescription  drug management:  I ordered medication including ivfs/abx  for sx  Reevaluation of the patient after these medicines showed that the patient improved I have reviewed the patients home medicines and have made adjustments as needed   Test Considered:  ct   Critical Interventions:  Ivfs/abx   Consultations Obtained:  I requested consultation with the hospitalist (Dr. Margo Aye),  and discussed lab and imaging findings as well as pertinent plan -she will admit   Problem List / ED Course:  Sepsis:  Code sepsis called.  IVFs and IV abx given.  HR has improved with fluids.  BP has remained stable.  Lactic did go up and additional IVFs given. Multifocal pneumonia:  pt given IV rocephin/zithromax.   AKI:  pt given ivfs   Reevaluation:  After the interventions noted above, I reevaluated the patient and found that they have :improved   Social Determinants of Health:  Lives at home   Dispostion:  After consideration of the diagnostic results and the patients response to treatment, I feel that the patent would benefit from admission.  CRITICAL CARE Performed by: Jacalyn Lefevre   Total critical care time: 30 minutes  Critical care time was exclusive of separately billable procedures and treating other patients.  Critical care was necessary to treat or prevent imminent or life-threatening deterioration.  Critical care was time spent personally by me on the following activities: development of treatment plan with patient and/or surrogate as well as nursing, discussions with consultants, evaluation of patient's response to treatment, examination of patient, obtaining history from patient or surrogate, ordering and performing treatments and interventions, ordering and review of laboratory studies, ordering and review of radiographic studies, pulse oximetry and re-evaluation of patient's condition.           Final Clinical Impression(s) / ED Diagnoses Final diagnoses:  AKI  (acute kidney injury) (HCC)  Sepsis without acute organ dysfunction, due to unspecified organism (HCC)  Hyperglycemia  Multifocal pneumonia  Rx / DC Orders ED Discharge Orders     None         Jacalyn Lefevre, MD 10/09/23 2209

## 2023-10-09 NOTE — Sepsis Progress Note (Signed)
Antibiotics hung after blood cultures drawn.

## 2023-10-10 ENCOUNTER — Other Ambulatory Visit: Payer: Self-pay

## 2023-10-10 DIAGNOSIS — J189 Pneumonia, unspecified organism: Secondary | ICD-10-CM

## 2023-10-10 LAB — HIV ANTIBODY (ROUTINE TESTING W REFLEX): HIV Screen 4th Generation wRfx: NONREACTIVE

## 2023-10-10 LAB — CBC
HCT: 35.7 % — ABNORMAL LOW (ref 36.0–46.0)
Hemoglobin: 12.1 g/dL (ref 12.0–15.0)
MCH: 29.4 pg (ref 26.0–34.0)
MCHC: 33.9 g/dL (ref 30.0–36.0)
MCV: 86.7 fL (ref 80.0–100.0)
Platelets: 261 10*3/uL (ref 150–400)
RBC: 4.12 MIL/uL (ref 3.87–5.11)
RDW: 14.6 % (ref 11.5–15.5)
WBC: 22.9 10*3/uL — ABNORMAL HIGH (ref 4.0–10.5)
nRBC: 0.1 % (ref 0.0–0.2)

## 2023-10-10 LAB — TSH: TSH: 0.649 u[IU]/mL (ref 0.350–4.500)

## 2023-10-10 LAB — LACTIC ACID, PLASMA
Lactic Acid, Venous: 1.4 mmol/L (ref 0.5–1.9)
Lactic Acid, Venous: 2.8 mmol/L (ref 0.5–1.9)
Lactic Acid, Venous: 2.8 mmol/L (ref 0.5–1.9)
Lactic Acid, Venous: 3.6 mmol/L (ref 0.5–1.9)
Lactic Acid, Venous: 4.1 mmol/L (ref 0.5–1.9)

## 2023-10-10 LAB — BASIC METABOLIC PANEL
Anion gap: 14 (ref 5–15)
BUN: 16 mg/dL (ref 6–20)
CO2: 22 mmol/L (ref 22–32)
Calcium: 8.9 mg/dL (ref 8.9–10.3)
Chloride: 97 mmol/L — ABNORMAL LOW (ref 98–111)
Creatinine, Ser: 0.99 mg/dL (ref 0.44–1.00)
GFR, Estimated: >60 mL/min (ref >60)
Glucose, Bld: 192 mg/dL — ABNORMAL HIGH (ref 70–99)
Potassium: 3.7 mmol/L (ref 3.5–5.1)
Sodium: 133 mmol/L — ABNORMAL LOW (ref 135–145)

## 2023-10-10 LAB — GLUCOSE, CAPILLARY
Glucose-Capillary: 138 mg/dL — ABNORMAL HIGH (ref 70–99)
Glucose-Capillary: 144 mg/dL — ABNORMAL HIGH (ref 70–99)

## 2023-10-10 LAB — T4, FREE: Free T4: 1.2 ng/dL — ABNORMAL HIGH (ref 0.61–1.12)

## 2023-10-10 MED ORDER — ACETAMINOPHEN 650 MG RE SUPP: 650.0000 mg | Freq: Four times a day (QID) | RECTAL | Status: DC | PRN

## 2023-10-10 MED ORDER — ACETAMINOPHEN 325 MG PO TABS
650.0000 mg | ORAL_TABLET | Freq: Once | ORAL | Status: DC
  Filled 2023-10-10: qty 2

## 2023-10-10 MED ORDER — ACETAMINOPHEN 160 MG/5ML PO SOLN
650.0000 mg | Freq: Once | ORAL | Status: AC
  Administered 2023-10-10: 650 mg via ORAL
  Filled 2023-10-10: qty 20.3

## 2023-10-10 MED ORDER — LACTATED RINGERS IV BOLUS
1000.0000 mL | Freq: Once | INTRAVENOUS | Status: AC
Start: 2023-10-10 — End: 2023-10-10
  Administered 2023-10-10: 1000 mL via INTRAVENOUS

## 2023-10-10 MED ORDER — HYDRALAZINE HCL 20 MG/ML IJ SOLN
10.0000 mg | Freq: Four times a day (QID) | INTRAMUSCULAR | Status: DC | PRN
  Administered 2023-10-10 – 2023-10-12 (×4): 10 mg via INTRAVENOUS
  Filled 2023-10-10 (×4): qty 1

## 2023-10-10 MED ORDER — ALBUTEROL SULFATE (2.5 MG/3ML) 0.083% IN NEBU
5.0000 mg | INHALATION_SOLUTION | Freq: Once | RESPIRATORY_TRACT | Status: AC
  Administered 2023-10-10: 5 mg via RESPIRATORY_TRACT
  Filled 2023-10-10: qty 6

## 2023-10-10 MED ORDER — ENOXAPARIN SODIUM 30 MG/0.3ML IJ SOSY
30.0000 mg | PREFILLED_SYRINGE | Freq: Every day | INTRAMUSCULAR | Status: DC
  Administered 2023-10-10: 30 mg via SUBCUTANEOUS
  Filled 2023-10-10: qty 0.3

## 2023-10-10 MED ORDER — ALBUTEROL SULFATE (2.5 MG/3ML) 0.083% IN NEBU
2.5000 mg | INHALATION_SOLUTION | Freq: Four times a day (QID) | RESPIRATORY_TRACT | Status: DC | PRN
  Administered 2023-10-10: 2.5 mg via RESPIRATORY_TRACT
  Filled 2023-10-10: qty 3

## 2023-10-10 MED ORDER — INSULIN ASPART 100 UNIT/ML IJ SOLN
0.0000 [IU] | Freq: Every day | INTRAMUSCULAR | Status: DC
  Administered 2023-10-12: 2 [IU] via SUBCUTANEOUS

## 2023-10-10 MED ORDER — OXYCODONE HCL 5 MG PO TABS
5.0000 mg | ORAL_TABLET | ORAL | Status: DC | PRN
  Administered 2023-10-10 – 2023-10-11 (×3): 5 mg via ORAL
  Filled 2023-10-10 (×3): qty 1

## 2023-10-10 MED ORDER — AMLODIPINE BESYLATE 5 MG PO TABS
5.0000 mg | ORAL_TABLET | Freq: Every day | ORAL | Status: DC
  Administered 2023-10-10 – 2023-10-13 (×4): 5 mg via ORAL
  Filled 2023-10-10 (×4): qty 1

## 2023-10-10 MED ORDER — ACETAMINOPHEN 325 MG PO TABS
650.0000 mg | ORAL_TABLET | Freq: Four times a day (QID) | ORAL | Status: DC | PRN
  Administered 2023-10-11: 650 mg via ORAL
  Filled 2023-10-10 (×2): qty 2

## 2023-10-10 MED ORDER — INSULIN ASPART 100 UNIT/ML IJ SOLN
0.0000 [IU] | Freq: Three times a day (TID) | INTRAMUSCULAR | Status: DC
  Administered 2023-10-10: 1 [IU] via SUBCUTANEOUS
  Administered 2023-10-13: 5 [IU] via SUBCUTANEOUS

## 2023-10-10 NOTE — ED Notes (Signed)
Pt ambulatory to restroom at this time. 

## 2023-10-10 NOTE — ED Notes (Addendum)
Upon exam/assessment, Pts HR/BP/Temp/RR noted elevated. Provider informed...  HR - 140 -150 BP - 200/100 No Hx of hypertension Temp - 99.9 RR - 24 Coughing and felt more SOB

## 2023-10-10 NOTE — ED Notes (Signed)
Report given to the next RN.Marland KitchenMarland Kitchen

## 2023-10-10 NOTE — Plan of Care (Signed)
  Problem: Health Behavior/Discharge Planning: Goal: Ability to manage health-related needs will improve Outcome: Progressing   Problem: Clinical Measurements: Goal: Ability to maintain clinical measurements within normal limits will improve Outcome: Progressing Goal: Will remain free from infection Outcome: Progressing Goal: Diagnostic test results will improve Outcome: Progressing   

## 2023-10-10 NOTE — Significant Event (Signed)
Repeat labs this afternoon reviewed. WBC count improving, creatinine improved to 0.99.  Lactic acid trending up however. Blood pressure persistently elevated.  Extremities warm.  Seems to be perfusing well.  Unclear etiology of persistent lactic acidosis. Repeat lactate level tomorrow. Start amlodipine for blood pressure Obtain echo given persistent tachycardia.  EKG earlier showed sinus tachycardia.

## 2023-10-10 NOTE — H&P (Signed)
[facility] History and Physical  Angelica Steele IEP:[id number] DOB: 06/18/1976 DOA: 10/09/2023 PCP: Patient, No Pcp Per  Presented from: Home Chief Complaint: Fever, cough shortness of breath  History of Present Illness: Angelica Steele is a 47 y.o. female with PMH significant for gestational diabetes, anemia 11/19, patient presented to ED at [facility] from home with complaint of high fever of 104, cough, nausea, vomiting, diarrhea for 4 days.  She started feeling sick last Wednesday.  In the next 3 days, she developed fever up to 103, cough, fatigue, diarrhea.  She was unable to hold anything down.  She tried Tylenol at home without relief and ultimately presented to ED on 11/19.  In the ED, patient had a fever of 103, heart rate initially was elevated to 154, blood pressure in 140s, breathing on room air. Initial labs showed WBC count elevated to 30,000, sodium low at 129, glucose elevated to 86, BUN/creatinine elevated to 32/1.26, AST, ALT elevated to 68/148, bilirubin of 1.9 lactic acid was initially elevated to 3.3. Urinalysis showed hazy yellow urine with 100 glucose, no protein, rare bacteria CT chest showed right lower lobe consolidation with scattered airspace disease in the right lower lobe and left upper lobe, consistent with multifocal pneumonia. No acute process and CT abdomen and pelvis.  Noted to have hepatic steatosis and uterine fibroid. VBG showed pH alkalotic at 7.43, HCO3 23 Blood culture was sent Patient was started on IV Rocephin, IV azithromycin Accepted to [facility] for inpatient management  I evaluated the patient after she arrived to [facility] this afternoon. Overnight vital signs reviewed. Patient received a total of 5 L of LR since last night. Lactic acid overnight trended as high as 4.6 and gradually down trended, 2.8 on last check this morning. No fever, heart rate fluctuating up to 140s, blood pressure consistently elevated.  Was as high as 211/90 this morning, this  afternoon, it is 186/98. Patient is breathing on room air.  At the time of my evaluation, patient was lying down in bed.  Looks okay clinically but is tachycardic, hypertensive with significantly elevated WBC count and lactic acid.  Review of Systems:  All systems were reviewed and were negative unless otherwise mentioned in the HPI   Past medical history: Past Medical History:  Diagnosis Date   Anemia    Diabetes mellitus Gestional Diet Controlled    Past surgical history: Past Surgical History:  Procedure Laterality Date   APPENDECTOMY  62yrs ago   CESAREAN SECTION     CESAREAN SECTION  06/05/2011   Procedure: CESAREAN SECTION;  Surgeon: Fortino Sic, MD;  Location: [facility];  Service: Gynecology;  Laterality: N/A;    Social History:  reports that she has never smoked. She has never used smokeless tobacco. She reports that she does not drink alcohol and does not use drugs.  Allergies:  No Known Allergies Patient has no known allergies.   Family history:  Family History  Problem Relation Age of Onset   Other Neg Hx      Physical Exam: Vitals:   10/10/23 1100 10/10/23 1130 10/10/23 1222 10/10/23 1320  BP: (!) 149/118 (!) 165/107  (!) 186/98  Pulse: (!) 124 (!) 123  (!) 143  Resp: (!) 21 18  20   Temp:   98.7 F (37.1 C) 99.9 F (37.7 C)  TempSrc:   Oral Oral  SpO2: 97% 97%  97%   Wt Readings from Last 3 Encounters:  03/13/14 59 kg  11/11/13 61.2 kg  10/20/12  65 kg   There is no height or weight on file to calculate BMI.  General exam: Pleasant middle-aged female.   Skin: No rashes, lesions or ulcers. HEENT: Atraumatic, normocephalic, no obvious bleeding Lungs: Clear to auscultation bilaterally CVS: Tachycardic, no murmur GI/Abd soft, nontender, nondistended, bowel sound present CNS: Alert, awake, oriented x 3 Psychiatry: Mood appropriate Extremities: No pedal edema, no calf  tenderness   ------------------------------------------------------------------------------------------------------ Assessment/Plan: Principal Problem:   CAP (community acquired pneumonia)  Severe sepsis Multifocal pneumonia Blood culture sent. Started on IV Rocephin and azithromycin No recurrence of fever but remains tachycardic, hypertensive. Lactic acid level downtrending but still high.  Continue to monitor Repeat WBC count Recent Labs  Lab 10/09/23 1752 10/09/23 1942 10/09/23 2146 10/10/23 0013 10/10/23 0751 10/10/23 1050  WBC 29.9*  --   --   --   --   --   LATICACIDVEN 3.3* 4.6* 4.2* 2.8* 4.1* 2.8*   Hypertension Blood pressure significantly elevated to over 200 this morning.   Last check 186/98 this afternoon PTA meds none Start IV hydralazine as needed  Hyperglycemia H/o gestational diabetes Blood sugar level elevated to 286 on initial workup. Obtain A1c. PTA meds-none Start SSI/Accu-Cheks No results found for: "HGBA1C" No results for input(s): "GLUCAP" in the last 168 hours.  Acute gastroenteritis  Had NVD, poor oral intake for 4 days Symptomatic management IV fluid Start with clear liquid Continue to monitor  Elevated liver enzymes  Hepatic steatosis AST/ALT/total bili elevated to 68/148 and 1.9 CT abdomen showed hepatic steatosis. Continue to monitor Recent Labs  Lab 10/09/23 1752 10/09/23 1825 10/09/23 1856  AST  --  68*  --   ALT  --  148*  --   ALKPHOS  --  122  --   BILITOT  --  1.9*  --   PROT  --  7.4  --   ALBUMIN  --  3.4*  --   INR  --   --  1.4*  PLT 266  --   --    Elevated creatinine Creatinine elevated to 1.26.  No baseline available.  Continue to monitor Recent Labs    10/09/23 1825  BUN 32*  CREATININE 1.26*   Hyponatremia Sodium level low likely due to nausea, vomiting, diarrhea  Continue to monitor Recent Labs  Lab 10/09/23 1825 10/09/23 2059  NA 129* 130*   Mobility: Encourage ambulation  Goals of  care   Code Status: Full Code    DVT prophylaxis:  enoxaparin (LOVENOX) injection 30 mg Start: 10/10/23 1430   Antimicrobials: IV Rocephin, IV azithromycin Fluid: Start NS at 75 mL/h Consultants: None Family Communication: None at bedside  Dispo: The patient is from: Home              Anticipated d/c is to: Home in 2 to 3 days  Diet: Diet Order             Diet regular Room service appropriate? Yes; Fluid consistency: Thin  Diet effective now                    ------------------------------------------------------------------------------------- Severity of Illness: The appropriate patient status for this patient is INPATIENT. Inpatient status is judged to be reasonable and necessary in order to provide the required intensity of service to ensure the patient's safety. The patient's presenting symptoms, physical exam findings, and initial radiographic and laboratory data in the context of their chronic comorbidities is felt to place them at high risk for further clinical deterioration.  Furthermore, it is not anticipated that the patient will be medically stable for discharge from the hospital within 2 midnights of admission.   * I certify that at the point of admission it is my clinical judgment that the patient will require inpatient hospital care spanning beyond 2 midnights from the point of admission due to high intensity of service, high risk for further deterioration and high frequency of surveillance required.* -------------------------------------------------------------------------------------  Home Meds: Prior to Admission medications   Medication Sig Start Date End Date Taking? Authorizing Provider  Acetaminophen-DM (TYLENOL COUGH/SORE THROAT PO) Take by mouth. Otc liquid   Yes [provider]  HYDROcodone-acetaminophen (NORCO/VICODIN) 5-325 MG per tablet Take 1-2 tablets by mouth every 6 (six) hours as needed for moderate pain. Patient not taking: Reported  on 01/26/2016 03/14/14   Elwin Mocha, MD  ibuprofen (ADVIL,MOTRIN) 600 MG tablet Take 1 tablet (600 mg total) by mouth every 6 (six) hours as needed. Patient not taking: Reported on 10/10/2023 03/14/14   Elwin Mocha, MD    Labs on Admission:   CBC: Recent Labs  Lab 10/09/23 1752 10/09/23 2059  WBC 29.9*  --   NEUTROABS 27.4*  --   HGB 14.2 11.9*  HCT 41.8 35.0*  MCV 86.5  --   PLT 266  --     Basic Metabolic Panel: Recent Labs  Lab 10/09/23 1825 10/09/23 2059  NA 129* 130*  K 3.6 3.5  CL 92*  --   CO2 23  --   GLUCOSE 286*  --   BUN 32*  --   CREATININE 1.26*  --   CALCIUM 10.0  --     Liver Function Tests: Recent Labs  Lab 10/09/23 1825  AST 68*  ALT 148*  ALKPHOS 122  BILITOT 1.9*  PROT 7.4  ALBUMIN 3.4*   No results for input(s): "LIPASE", "AMYLASE" in the last 168 hours. No results for input(s): "AMMONIA" in the last 168 hours.  Cardiac Enzymes: No results for input(s): "CKTOTAL", "CKMB", "CKMBINDEX", "TROPONINI" in the last 168 hours.  BNP (last 3 results) No results for input(s): "BNP" in the last 8760 hours.  ProBNP (last 3 results) No results for input(s): "PROBNP" in the last 8760 hours.  CBG: No results for input(s): "GLUCAP" in the last 168 hours.  Lipase  No results found for: "LIPASE"   Urinalysis    Component Value Date/Time   COLORURINE YELLOW 10/09/2023 2028   APPEARANCEUR HAZY (A) 10/09/2023 2028   LABSPEC 1.026 10/09/2023 2028   PHURINE 5.5 10/09/2023 2028   GLUCOSEU 100 (A) 10/09/2023 2028   HGBUR NEGATIVE 10/09/2023 2028   BILIRUBINUR NEGATIVE 10/09/2023 2028   KETONESUR NEGATIVE 10/09/2023 2028   PROTEINUR 100 (A) 10/09/2023 2028   UROBILINOGEN 0.2 03/13/2014 2111   NITRITE NEGATIVE 10/09/2023 2028   LEUKOCYTESUR NEGATIVE 10/09/2023 2028     Drugs of Abuse  No results found for: "LABOPIA", "COCAINSCRNUR", "LABBENZ", "AMPHETMU", "THCU", "LABBARB"    Radiological Exams on Admission: CT CHEST ABDOMEN PELVIS W  CONTRAST  Result Date: 10/09/2023 CLINICAL DATA:  Sepsis. Nausea, vomiting, and diarrhea. Fever and dry cough. EXAM: CT CHEST, ABDOMEN, AND PELVIS WITH CONTRAST TECHNIQUE: Multidetector CT imaging of the chest, abdomen and pelvis was performed following the standard protocol during bolus administration of intravenous contrast. RADIATION DOSE REDUCTION: This exam was performed according to the departmental dose-optimization program which includes automated exposure control, adjustment of the mA and/or kV according to patient size and/or use of iterative reconstruction technique. CONTRAST:  85mL OMNIPAQUE  IOHEXOL 300 MG/ML  SOLN COMPARISON:  03/13/2014. FINDINGS: CT CHEST FINDINGS Cardiovascular: The heart is enlarged and there is no pericardial effusion. The aorta and pulmonary trunk are normal in caliber. Mediastinum/Nodes: No mediastinal, hilar, or axillary lymphadenopathy. The thyroid gland, trachea, and esophagus are within normal limits. Lungs/Pleura: Scattered airspace opacities are noted in the perihilar region in the left upper lobe. There is consolidation with a associated airspace disease in the left lower lobe. No effusion or pneumothorax is seen. Musculoskeletal: Degenerative changes are present in the thoracic spine. No acute osseous abnormality. CT ABDOMEN PELVIS FINDINGS Hepatobiliary: No focal liver abnormality is seen. Fatty infiltration of the liver is noted. No gallstones, gallbladder wall thickening, or biliary dilatation. Pancreas: Unremarkable. No pancreatic ductal dilatation or surrounding inflammatory changes. Spleen: Normal in size without focal abnormality. Adrenals/Urinary Tract: The adrenal glands are within normal limits. The kidneys enhance symmetrically. No renal calculus or hydronephrosis bilaterally. The bladder is nondistended. Stomach/Bowel: Stomach is within normal limits. Appendix is surgically absent. No evidence of bowel wall thickening, distention, or inflammatory changes.  No free air or pneumatosis. Vascular/Lymphatic: Aortic atherosclerosis. No enlarged abdominal or pelvic lymph nodes. Reproductive: A uterine fibroid is noted on the right. No adnexal mass. Other: No abdominopelvic ascites. Musculoskeletal: Degenerative changes are present in the lumbar spine. No acute osseous abnormality. IMPRESSION: 1. Right lower lobe consolidation with scattered airspace disease in the right lower lobe and left upper lobe, consistent with multifocal pneumonia. 2. No acute process in the abdomen and pelvis 3. Hepatic steatosis. 4. Uterine fibroid. 5. Aortic atherosclerosis. Electronically Signed   By: Thornell Sartorius M.D.   On: 10/09/2023 21:36   DG Chest Port 1 View  Result Date: 10/09/2023 CLINICAL DATA:  Possible sepsis. Nausea, vomiting, and diarrhea. Fever and dry cough. EXAM: PORTABLE CHEST 1 VIEW COMPARISON:  None Available. FINDINGS: The heart is enlarged and mediastinal contours are within normal limits. Focal consolidation is noted in the infrahilar region on the right. Mild airspace disease is noted in the perihilar region on the left. No effusion or pneumothorax bilaterally. No acute osseous abnormality. IMPRESSION: 1. Focal consolidation in the infrahilar region on the right and mild perihilar airspace disease on the left, suggesting multifocal pneumonia. 2. Cardiomegaly. Electronically Signed   By: Thornell Sartorius M.D.   On: 10/09/2023 21:27     Signed, Lorin Glass, MD [facility] 10/10/2023

## 2023-10-10 NOTE — ED Notes (Signed)
RT Note:  Upon assessing the patient post nebulizer treatment, she appears to be breathing easier with a less forceful cough. Patient stated she feels like she can breath deeper. She is resting

## 2023-10-10 NOTE — ED Notes (Signed)
Monisha with cl called for transport

## 2023-10-10 NOTE — ED Provider Notes (Signed)
  Physical Exam  BP (!) 195/109   Pulse (!) 135   Temp 98.4 F (36.9 C) (Oral)   Resp (!) 23   SpO2 95%   Physical Exam  Procedures  Procedures  ED Course / MDM   Clinical Course as of 10/11/23 1147  Wed Oct 10, 2023  8338 Discussed with Dr. Erenest Blank to make aware of increased hr and fluid bolus given.  Lactic acid repeated and increased from first prior [DR]    Clinical Course User Index [DR] Margarita Grizzle, MD   Medical Decision Making Amount and/or Complexity of Data Reviewed Labs: ordered. Radiology: ordered. ECG/medicine tests: ordered.  Risk OTC drugs. Prescription drug management. Decision regarding hospitalization.   47 year old female seen last night and being admitted for sepsis, AKI, multifocal pneumonia, and hyperglycemia.  Patient is pending bed at [facility].  I was alerted to the fact that the patient is tachycardic here in the ED.  Currently her heart rate is up to 135.  It had been down to 110.  I reviewed the chart.  Patient received Rocephin and Zithromax IV.  She received IV fluids. Lactic acid is trended from 3.3, 4.6-4.2 with last being 2.8 White blood cell count was 29,900 Reviewed EKG is sinus tachycardia at 135 1 L LR ordered and acetaminophen 650 mg ordered. Patient will have some albuterol ordered.  She is asked for albuterol and does not have any history of asthma but does have some and expiratory wheezing and diffuse rhonchi noted on my exam ED ECG REPORT   Date: 10/10/2023  Rate: 29  Rhythm: sinus tachycardia  QRS Axis: right  Intervals: normal  ST/T Wave abnormalities: nonspecific ST changes  Conduction Disutrbances:right bundle branch block  Narrative Interpretation:   Old EKG Reviewed: changes noted  I have personally reviewed the EKG tracing and agree with the computerized printout as noted.  Liter LR ordered Repeat lactic ordered Acetaminophen ordered Page placed to hospitalist to alert them to patient's current  condition  Patient being transferred to inpatient facility. She appears stable for transport     Margarita Grizzle, MD 10/11/23 1148

## 2023-10-10 NOTE — ED Notes (Addendum)
RT Note: Patient was calling out stating it was harder for her to catch her breath. RT consulted with Physician because the patient's heart rate is elevated. Patient has a continuous cough. SPO2 93% RA. Physician ordered a nebulizer treatment. Patient given nebulizer treatment.Patient is currently tolerating well at this time

## 2023-10-10 NOTE — ED Notes (Signed)
Pt provided tomato soup and crackers per request. MD in agreement.

## 2023-10-10 NOTE — ED Notes (Addendum)
Pt ambulatory to restroom with steady gate, reports episode of diarrhea.

## 2023-10-10 NOTE — ED Notes (Signed)
Patient status discussed with ED provider

## 2023-10-11 ENCOUNTER — Inpatient Hospital Stay (HOSPITAL_COMMUNITY): Payer: Self-pay

## 2023-10-11 DIAGNOSIS — R9431 Abnormal electrocardiogram [ECG] [EKG]: Secondary | ICD-10-CM

## 2023-10-11 LAB — GLUCOSE, CAPILLARY
Glucose-Capillary: 115 mg/dL — ABNORMAL HIGH (ref 70–99)
Glucose-Capillary: 167 mg/dL — ABNORMAL HIGH (ref 70–99)
Glucose-Capillary: 149 mg/dL — ABNORMAL HIGH (ref 70–99)

## 2023-10-11 LAB — CBC
HCT: 32.8 % — ABNORMAL LOW (ref 36.0–46.0)
Hemoglobin: 11.1 g/dL — ABNORMAL LOW (ref 12.0–15.0)
MCH: 29.7 pg (ref 26.0–34.0)
MCHC: 33.8 g/dL (ref 30.0–36.0)
MCV: 87.7 fL (ref 80.0–100.0)
Platelets: 232 10*3/uL (ref 150–400)
RBC: 3.74 MIL/uL — ABNORMAL LOW (ref 3.87–5.11)
RDW: 14.7 % (ref 11.5–15.5)
WBC: 20.6 10*3/uL — ABNORMAL HIGH (ref 4.0–10.5)
nRBC: 0 % (ref 0.0–0.2)

## 2023-10-11 LAB — HEMOGLOBIN A1C
Hgb A1c MFr Bld: 8.1 % — ABNORMAL HIGH (ref 4.8–5.6)
Mean Plasma Glucose: 185.77 mg/dL

## 2023-10-11 LAB — COMPREHENSIVE METABOLIC PANEL
ALT: 66 U/L — ABNORMAL HIGH (ref 0–44)
AST: 30 U/L (ref 15–41)
Albumin: 2 g/dL — ABNORMAL LOW (ref 3.5–5.0)
Alkaline Phosphatase: 113 U/L (ref 38–126)
Anion gap: 9 (ref 5–15)
BUN: 10 mg/dL (ref 6–20)
CO2: 23 mmol/L (ref 22–32)
Calcium: 8 mg/dL — ABNORMAL LOW (ref 8.9–10.3)
Chloride: 99 mmol/L (ref 98–111)
Creatinine, Ser: 0.72 mg/dL (ref 0.44–1.00)
GFR, Estimated: >60 mL/min (ref >60)
Glucose, Bld: 150 mg/dL — ABNORMAL HIGH (ref 70–99)
Potassium: 3.5 mmol/L (ref 3.5–5.1)
Sodium: 131 mmol/L — ABNORMAL LOW (ref 135–145)
Total Bilirubin: 1.3 mg/dL — ABNORMAL HIGH (ref <1.2)
Total Protein: 6 g/dL — ABNORMAL LOW (ref 6.5–8.1)

## 2023-10-11 LAB — ECHOCARDIOGRAM COMPLETE
Height: 61 in
S' Lateral: 3.6 cm
Weight: 2370.39 [oz_av]

## 2023-10-11 LAB — HEPATITIS PANEL, ACUTE
HCV Ab: NONREACTIVE
Hep A IgM: NONREACTIVE
Hep B C IgM: NONREACTIVE
Hepatitis B Surface Ag: NONREACTIVE

## 2023-10-11 MED ORDER — BENZONATATE 100 MG PO CAPS
100.0000 mg | ORAL_CAPSULE | Freq: Three times a day (TID) | ORAL | Status: DC
  Administered 2023-10-11 – 2023-10-13 (×5): 100 mg via ORAL
  Filled 2023-10-11 (×5): qty 1

## 2023-10-11 MED ORDER — AZITHROMYCIN 250 MG PO TABS
500.0000 mg | ORAL_TABLET | Freq: Every day | ORAL | Status: AC
  Administered 2023-10-11 – 2023-10-13 (×3): 500 mg via ORAL
  Filled 2023-10-11 (×3): qty 2

## 2023-10-11 MED ORDER — GUAIFENESIN-DM 100-10 MG/5ML PO SYRP
5.0000 mL | ORAL_SOLUTION | Freq: Four times a day (QID) | ORAL | Status: DC | PRN
  Administered 2023-10-12 (×2): 5 mL via ORAL
  Filled 2023-10-11 (×2): qty 5

## 2023-10-11 MED ORDER — CARVEDILOL 3.125 MG PO TABS
3.1250 mg | ORAL_TABLET | Freq: Two times a day (BID) | ORAL | Status: DC
  Administered 2023-10-11 – 2023-10-13 (×4): 3.125 mg via ORAL
  Filled 2023-10-11 (×4): qty 1

## 2023-10-11 MED ORDER — GUAIFENESIN ER 600 MG PO TB12: 600.0000 mg | ORAL_TABLET | Freq: Two times a day (BID) | ORAL | Status: DC

## 2023-10-11 NOTE — Plan of Care (Signed)
  Problem: Clinical Measurements: Goal: Ability to maintain clinical measurements within normal limits will improve Outcome: Not Progressing   Problem: Clinical Measurements: Goal: Respiratory complications will improve Outcome: Not Progressing   Problem: Clinical Measurements: Goal: Cardiovascular complication will be avoided Outcome: Not Progressing   Problem: Activity: Goal: Risk for activity intolerance will decrease Outcome: Not Progressing

## 2023-10-11 NOTE — Plan of Care (Signed)
  Problem: Safety: Goal: Ability to remain free from injury will improve Outcome: Progressing   Problem: Skin Integrity: Goal: Risk for impaired skin integrity will decrease Outcome: Progressing   Problem: Fluid Volume: Goal: Ability to maintain a balanced intake and output will improve Outcome: Progressing

## 2023-10-11 NOTE — Progress Notes (Addendum)
   10/11/23 0015  Assess: MEWS Score  Temp (!) 102.1 F (38.9 C)  BP (!) 176/91  MAP (mmHg) 115  Pulse Rate (!) 127  ECG Heart Rate (!) 129  Resp 20  Level of Consciousness Alert  SpO2 (!) 88 %  O2 Device Room Air  Assess: MEWS Score  MEWS Temp 2  MEWS Systolic 0  MEWS Pulse 2  MEWS RR 0  MEWS LOC 0  MEWS Score 4  MEWS Score Color Red  Assess: if the MEWS score is Yellow or Red  Were vital signs accurate and taken at a resting state? Yes  Does the patient meet 2 or more of the SIRS criteria? Yes  Does the patient have a confirmed or suspected source of infection? Yes  MEWS guidelines implemented  Yes, red  Treat  MEWS Interventions Considered administering scheduled or prn medications/treatments as ordered  Take Vital Signs  Increase Vital Sign Frequency  Red: Q1hr x2, continue Q4hrs until patient remains green for 12hrs  Escalate  MEWS: Escalate Red: Discuss with charge nurse and notify provider. Consider notifying RRT. If remains red for 2 hours consider need for higher level of care  Notify: Charge Nurse/RN  Name of Charge Nurse/RN Notified Acupuncturist Name/Title V.Rathore MD  Date Provider Notified 10/11/23  Time Provider Notified [phone]  Method of Notification Page  Notification Reason Other (Comment) (first RED MEWS for this shift.)  Provider response No new orders  Assess: SIRS CRITERIA  SIRS Temperature  1  SIRS Pulse 1  SIRS Respirations  0  SIRS WBC 1  SIRS Score Sum  3   Reassessed o2sat 95%RA. Blood cultures pending. Prn hydralazine & prn tylenol given.

## 2023-10-11 NOTE — Progress Notes (Signed)
PROGRESS NOTE  Angelica Steele  DOB: 1976/01/17  PCP: Patient, No Pcp Per ION:[id number]  DOA: 10/09/2023  LOS: 1 day  Hospital Day: 3  Brief narrative: Angelica Steele is a 47 y.o. female with PMH significant for gestational diabetes, anemia 11/19, patient presented to ED at [facility] from home with complaint of high fever of 104, cough, nausea, vomiting, diarrhea for 4 days.  She started feeling sick last Wednesday.  In the next 3 days, she developed fever up to 103, cough, fatigue, diarrhea.  She was unable to hold anything down.  She tried Tylenol at home without relief and ultimately presented to ED on 11/19.  In the ED, patient had a fever of 103, heart rate initially was elevated to 154, blood pressure in 140s, breathing on room air. Initial labs showed WBC count elevated to 30,000, sodium low at 129, glucose elevated to 86, BUN/creatinine elevated to 32/1.26, AST, ALT elevated to 68/148, bilirubin of 1.9 lactic acid was initially elevated to 3.3. Urinalysis showed hazy yellow urine with 100 glucose, no protein, rare bacteria Respiratory virus panel unremarkable CT chest showed right lower lobe consolidation with scattered airspace disease in the right lower lobe and left upper lobe, consistent with multifocal pneumonia. No acute process and CT abdomen and pelvis.  Noted to have hepatic steatosis and uterine fibroid. VBG showed pH alkalotic at 7.43, HCO3 23 Blood culture was sent Patient was started on IV Rocephin, IV azithromycin Accepted to [facility] at [facility].  Subjective: Patient was seen and examined this morning. Middle-aged female.  Sitting up in bed.  Looks tired. Overnight, patient spiked multiple episodes of fever as high as 102.1 Remains tachycardic to 100s, blood pressure fluctuating.  Was normal last night but up again to 180s. On 2 L oxygen this morning Repeat labs from this morning with WBC count improving to 20.6, creatinine normal, lactic acid improved to 1.4 I talked to her  husband on the phone during rounds.  Assessment/Plan: Severe sepsis Multifocal pneumonia Presented with fever, shortness of breath,  Found to have tachycardia, leukocytosis, multifocal pneumonia on chest x-ray  lactic acid level was elevated as high as 4.1 but patient was not hemodynamically compromised. Resp virus panel unremarkable. Send mycoplasma IgM antibody level Blood culture sent Started on IV Rocephin and azithromycin.  Currently continued on the same Spiked fever episodes again overnight. Repeat labs this morning however show improvement in WBC count, resolution of lactic acidosis Add Mucinex for cough. Recent Labs  Lab 10/09/23 1752 10/09/23 1942 10/10/23 0013 10/10/23 0751 10/10/23 1050 10/10/23 1413 10/10/23 2011 10/11/23 0314  WBC 29.9*  --   --   --   --  22.9*  --  20.6*  LATICACIDVEN 3.3*   < > 2.8* 4.1* 2.8* 3.6* 1.4  --    < > = values in this interval not displayed.   Sinus tachycardia  elevated blood pressure No prior history of hypertension Blood pressure was significantly elevated to over 200 yesterday.  Downtrending overall. Heart rate remains elevated.  Sinus tachycardia on multiple EKGs done since yesterday.  Continue to monitor Not on any antihypertensives at home.  Started on amlodipine 5 mg daily yesterday.  I will add Coreg 3.125 mg twice daily today. Continue IV hydralazine as needed  New onset diabetes mellitus H/o gestational diabetes A1c 8.1 on 11/24  PTA meds-none Currently on SSI/Accu-Cheks Will plan for oral agents prior to discharge. Recent Labs  Lab 10/10/23 1528 10/10/23 2132 10/11/23 0603 10/11/23 1103  GLUCAP 138*  144* 115* 167*   Acute gastroenteritis  Had NVD, poor oral intake for 4 days Symptomatic management Encourage oral hydration. Currently on regular diet.  No nausea or vomiting.  1 episode of loose bowel movement this morning. Continue to monitor GI symptoms  Elevated liver enzymes  Hepatic  steatosis AST/ALT/total bili was elevated.  Gradually downtrending CT abdomen showed hepatic steatosis. Continue to monitor Recent Labs  Lab 10/09/23 1752 10/09/23 1825 10/09/23 1856 10/10/23 1413 10/11/23 0314  AST  --  68*  --   --  30  ALT  --  148*  --   --  66*  ALKPHOS  --  122  --   --  113  BILITOT  --  1.9*  --   --  1.3*  PROT  --  7.4  --   --  6.0*  ALBUMIN  --  3.4*  --   --  2.0*  INR  --   --  1.4*  --   --   PLT 266  --   --  261 232   Elevated creatinine Creatinine was elevated to 1.26.  No baseline available.  Continue to monitor Recent Labs    10/09/23 1825 10/10/23 1413 10/11/23 0314  BUN 32* 16 10  CREATININE 1.26* 0.99 0.72   Hyponatremia Sodium level low likely due to nausea, vomiting, diarrhea  Continue to monitor Recent Labs  Lab 10/09/23 1825 10/09/23 2059 10/10/23 1413 10/11/23 0314  NA 129* 130* 133* 131*   Mobility: Encourage ambulation  Goals of care   Code Status: Full Code    DVT prophylaxis:  Place and maintain sequential compression device Start: 10/11/23 0901   Antimicrobials: IV Rocephin, IV azithromycin Fluid: None Consultants: None Family Communication: None at bedside  Status: Inpatient Level of care:  Progressive   Patient is from: Home Needs to continue in-hospital care: Continues to spike fever Anticipated d/c to: Pending clinical course    Diet:  Diet Order             Diet regular Room service appropriate? Yes; Fluid consistency: Thin  Diet effective now                   Scheduled Meds:  amLODipine  5 mg Oral Daily   azithromycin  500 mg Oral Daily   carvedilol  3.125 mg Oral BID WC   guaiFENesin  600 mg Oral BID   insulin aspart  0-5 Units Subcutaneous QHS   insulin aspart  0-9 Units Subcutaneous TID WC    PRN meds: acetaminophen **OR** acetaminophen, albuterol, hydrALAZINE, oxyCODONE   Infusions:   cefTRIAXone (ROCEPHIN)  IV Stopped (10/10/23 2130)     Antimicrobials: Anti-infectives (From admission, onward)    Start     Dose/Rate Route Frequency Ordered Stop   10/11/23 1200  azithromycin (ZITHROMAX) tablet 500 mg        500 mg Oral Daily 10/11/23 1110 10/14/23 0959   10/09/23 1800  cefTRIAXone (ROCEPHIN) 2 g in sodium chloride 0.9 % 100 mL IVPB        2 g 200 mL/hr over 30 Minutes Intravenous Every 24 hours 10/09/23 1758 10/14/23 1759   10/09/23 1800  azithromycin (ZITHROMAX) 500 mg in sodium chloride 0.9 % 250 mL IVPB  Status:  Discontinued        500 mg 250 mL/hr over 60 Minutes Intravenous Every 24 hours 10/09/23 1758 10/11/23 1110       Objective: Vitals:   10/11/23 1211 10/11/23  1300  BP: (!) 162/95 (!) 166/87  Pulse: (!) 129 (!) 129  Resp:    Temp:    SpO2: 93%     Intake/Output Summary (Last 24 hours) at 10/11/2023 1338 Last data filed at 10/11/2023 0900 Gross per 24 hour  Intake 1310 ml  Output --  Net 1310 ml   Filed Weights   10/10/23 1416 10/11/23 0420  Weight: 65.1 kg 67.2 kg   Weight change:  Body mass index is 27.99 kg/m.   Physical Exam: General exam: Pleasant middle-aged female.  Looks tired Skin: No rashes, lesions or ulcers. HEENT: Atraumatic, normocephalic, no obvious bleeding Lungs: Clear to auscultation bilaterally.  Coughs on deep breathing CVS: Tachycardic, no murmur GI/Abd soft, nontender, nondistended, bowel sound present CNS: Alert, awake, oriented x 3 Psychiatry: Mood appropriate Extremities: No pedal edema, no calf tenderness  Data Review: I have personally reviewed the laboratory data and studies available.  F/u labs ordered Unresulted Labs (From admission, onward)     Start     Ordered   10/12/23 0500  CBC with Differential/Platelet  Tomorrow morning,   R       Question:  Specimen collection method  Answer:  Lab=Lab collect   10/11/23 1333   10/12/23 0500  Basic metabolic panel  Tomorrow morning,   R       Question:  Specimen collection method  Answer:  Lab=Lab  collect   10/11/23 1333   10/12/23 0500  Lactic acid, plasma  (Lactic Acid)  Tomorrow morning,   R       Question:  Specimen collection method  Answer:  Lab=Lab collect   10/11/23 1333   10/11/23 1334  Mycoplasma pneumoniae antibody, IgM  Add-on,   AD       Question:  Specimen collection method  Answer:  Lab=Lab collect   10/11/23 1333            Total time spent in review of labs and imaging, patient evaluation, formulation of plan, documentation and communication with family: 55 minutes  Signed, Lorin Glass, MD [facility] 10/11/2023

## 2023-10-11 NOTE — TOC Initial Note (Addendum)
Transition of Care [facility]) - Initial/Assessment Note    Patient Details  Name: Angelica Steele MRN: [id number] Date of Birth: 02/25/1976  Transition of Care [facility]) CM/SW Contact:    Leone Haven, RN Phone Number: 10/11/2023, 8:46 AM  Clinical Narrative:                 From home with significant other, has no  PCP and no insurance on file, has no HH services in place at this time or DME at home.  States  partner will transport her home at [employer] and he is support system, states gets medications from [place] on [address].  Pta self ambulatory. Patient will need ast with Medications at discharge and needs to get apt with a [facility].  [facility].   Expected Discharge Plan: Home/Self Care Barriers to Discharge: Continued Medical Work up   Patient Goals and CMS Choice Patient states their goals for this hospitalization and ongoing recovery are:: return home   Choice offered to / list presented to : NA      Expected Discharge Plan and Services In-house Referral: NA Discharge Planning Services: CM Consult Post Acute Care Choice: NA Living arrangements for the past 2 months: Apartment                   DME Agency: NA       HH Arranged: NA          Prior Living Arrangements/Services Living arrangements for the past 2 months: Apartment Lives with:: Significant Other Patient language and need for interpreter reviewed:: Yes Do you feel safe going back to the place where you live?: Yes      Need for Family Participation in Patient Care: Yes (Comment) Care giver support system in place?: Yes (comment)   Criminal Activity/Legal Involvement Pertinent to Current Situation/Hospitalization: No - Comment as needed  Activities of Daily Living   ADL Screening (condition at time of admission) Independently performs ADLs?: Yes (appropriate for developmental age) Is the patient deaf or have difficulty hearing?: No Does the patient have difficulty  seeing, even when wearing glasses/contacts?: No Does the patient have difficulty concentrating, remembering, or making decisions?: No  Permission Sought/Granted Permission sought to share information with : Case Manager Permission granted to share information with : Yes, Verbal Permission Granted  Share Information with NAME: Irene Limbo     Permission granted to share info w Relationship: Significant other     Emotional Assessment Appearance:: Appears stated age Attitude/Demeanor/Rapport: Engaged Affect (typically observed): Appropriate Orientation: : Oriented to Self, Oriented to Place, Oriented to  Time, Oriented to Situation Alcohol / Substance Use: Not Applicable Psych Involvement: No (comment)  Admission diagnosis:  Hyperglycemia [R73.9] CAP (community acquired pneumonia) [J18.9] AKI (acute kidney injury) (HCC) [N17.9] Multifocal pneumonia [J18.9] Sepsis without acute organ dysfunction, due to unspecified organism [facility]) [A41.9] Patient Active Problem List   Diagnosis Date Noted   CAP (community acquired pneumonia) 10/09/2023   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   Anemia 06/05/2011   Advanced maternal age 49/16/2012   Oligohydramnios antepartum 06/05/2011   Previous cesarean delivery affecting pregnancy 06/05/2011   Gestational diabetes, diet controlled 06/05/2011   PCP:  Patient, No Pcp Per Pharmacy:   [id number] [place], [place] - [address] AT [facility] [address] [facility] [address] [place] [address] Phone: [phone] Fax: [phone]     Social Determinants of Health (SDOH) Social History:  SDOH Screenings   Food Insecurity: No Food Insecurity (10/10/2023)  Housing: Low Risk  (10/10/2023)  Transportation Needs: No Transportation Needs (10/10/2023)  Utilities: Not At Risk (10/10/2023)  Tobacco Use: Low Risk  (10/09/2023)   SDOH Interventions:     Readmission Risk Interventions     No data to  display

## 2023-10-12 ENCOUNTER — Inpatient Hospital Stay (HOSPITAL_COMMUNITY): Payer: Self-pay

## 2023-10-12 LAB — BASIC METABOLIC PANEL
Anion gap: 9 (ref 5–15)
BUN: 10 mg/dL (ref 6–20)
CO2: 22 mmol/L (ref 22–32)
Calcium: 8 mg/dL — ABNORMAL LOW (ref 8.9–10.3)
Chloride: 101 mmol/L (ref 98–111)
Creatinine, Ser: 0.68 mg/dL (ref 0.44–1.00)
GFR, Estimated: >60 mL/min (ref >60)
Glucose, Bld: 147 mg/dL — ABNORMAL HIGH (ref 70–99)
Potassium: 3.3 mmol/L — ABNORMAL LOW (ref 3.5–5.1)
Sodium: 132 mmol/L — ABNORMAL LOW (ref 135–145)

## 2023-10-12 LAB — CBC WITH DIFFERENTIAL/PLATELET
Abs Immature Granulocytes: 0 10*3/uL (ref 0.00–0.07)
Basophils Absolute: 0 10*3/uL (ref 0.0–0.1)
Basophils Relative: 0 %
Eosinophils Absolute: 0.2 10*3/uL (ref 0.0–0.5)
Eosinophils Relative: 1 %
HCT: 34 % — ABNORMAL LOW (ref 36.0–46.0)
Hemoglobin: 11.7 g/dL — ABNORMAL LOW (ref 12.0–15.0)
Lymphocytes Relative: 14 %
Lymphs Abs: 2.3 10*3/uL (ref 0.7–4.0)
MCH: 31 pg (ref 26.0–34.0)
MCHC: 34.4 g/dL (ref 30.0–36.0)
MCV: 90.2 fL (ref 80.0–100.0)
Monocytes Absolute: 0.8 10*3/uL (ref 0.1–1.0)
Monocytes Relative: 5 %
Neutro Abs: 13 10*3/uL — ABNORMAL HIGH (ref 1.7–7.7)
Neutrophils Relative %: 80 %
Platelets: 262 10*3/uL (ref 150–400)
RBC: 3.77 MIL/uL — ABNORMAL LOW (ref 3.87–5.11)
RDW: 14.9 % (ref 11.5–15.5)
WBC: 16.2 10*3/uL — ABNORMAL HIGH (ref 4.0–10.5)
nRBC: 0 % (ref 0.0–0.2)
nRBC: 0 /100{WBCs}

## 2023-10-12 LAB — LACTIC ACID, PLASMA: Lactic Acid, Venous: 1.1 mmol/L (ref 0.5–1.9)

## 2023-10-12 LAB — GLUCOSE, CAPILLARY
Glucose-Capillary: 129 mg/dL — ABNORMAL HIGH (ref 70–99)
Glucose-Capillary: 233 mg/dL — ABNORMAL HIGH (ref 70–99)
Glucose-Capillary: 176 mg/dL — ABNORMAL HIGH (ref 70–99)
Glucose-Capillary: 223 mg/dL — ABNORMAL HIGH (ref 70–99)

## 2023-10-12 MED ORDER — POTASSIUM CHLORIDE CRYS ER 20 MEQ PO TBCR
40.0000 meq | EXTENDED_RELEASE_TABLET | Freq: Once | ORAL | Status: AC
  Administered 2023-10-12: 40 meq via ORAL
  Filled 2023-10-12: qty 2

## 2023-10-12 MED ORDER — MELATONIN 5 MG PO TABS
5.0000 mg | ORAL_TABLET | Freq: Every evening | ORAL | Status: DC | PRN
  Administered 2023-10-12: 5 mg via ORAL
  Filled 2023-10-12: qty 1

## 2023-10-12 MED ORDER — FUROSEMIDE 10 MG/ML IJ SOLN
20.0000 mg | Freq: Once | INTRAMUSCULAR | Status: AC
  Administered 2023-10-12: 20 mg via INTRAVENOUS
  Filled 2023-10-12: qty 2

## 2023-10-12 NOTE — Plan of Care (Signed)
Care plan reviewed.

## 2023-10-12 NOTE — Progress Notes (Signed)
PROGRESS NOTE  Angelica Steele  DOB: 11-Apr-1976  PCP: Patient, No Pcp Per WGN:[id number]  DOA: 10/09/2023  LOS: 2 days  Hospital Day: 4  Brief narrative: Angelica Steele is a 47 y.o. female with PMH significant for gestational diabetes, anemia 11/19, patient presented to ED at [facility] from home with complaint of high fever of 104, cough, nausea, vomiting, diarrhea for 4 days.  She started feeling sick last Wednesday.  In the next 3 days, she developed fever up to 103, cough, fatigue, diarrhea.  She was unable to hold anything down.  She tried Tylenol at home without relief and ultimately presented to ED on 11/19.  In the ED, patient had a fever of 103, heart rate initially was elevated to 154, blood pressure in 140s, breathing on room air. Initial labs showed WBC count elevated to 30,000, sodium low at 129, glucose elevated to 86, BUN/creatinine elevated to 32/1.26, AST, ALT elevated to 68/148, bilirubin of 1.9 lactic acid was initially elevated to 3.3. Urinalysis showed hazy yellow urine with 100 glucose, no protein, rare bacteria Respiratory virus panel unremarkable CT chest showed right lower lobe consolidation with scattered airspace disease in the right lower lobe and left upper lobe, consistent with multifocal pneumonia. No acute process and CT abdomen and pelvis.  Noted to have hepatic steatosis and uterine fibroid. VBG showed pH alkalotic at 7.43, HCO3 23 Blood culture was sent Patient was started on IV Rocephin, IV azithromycin Accepted to [facility] at [facility].  Subjective: Patient was seen and examined this morning. Seen walking on the hallway today with mobility tech. O2 sat was 96% on room air.  Heart rate 120s on ambulation. Feels better than yesterday. No fever in last 24 hours. Labs show improvement as well.  Assessment/Plan: Severe sepsis Multifocal pneumonia Presented with fever, shortness of breath,  Found to have tachycardia, leukocytosis, multifocal pneumonia on chest x-ray   lactic acid level was elevated as high as 4.1 but patient was not hemodynamically compromised. Resp virus panel unremarkable. Pending mycoplasma IgM antibody level Blood culture did not show any growth. Patient was started on IV Rocephin and azithromycin.  Currently continued on the same No recurrent fever in last 24 hours. WBC count appropriately trending down.  Lactic acid normalized Add Mucinex for cough. Recent Labs  Lab 10/09/23 1752 10/09/23 1942 10/10/23 0751 10/10/23 1050 10/10/23 1413 10/10/23 2011 10/11/23 0314 10/12/23 0303  WBC 29.9*  --   --   --  22.9*  --  20.6* 16.2*  LATICACIDVEN 3.3*   < > 4.1* 2.8* 3.6* 1.4  --  1.1   < > = values in this interval not displayed.   Sinus tachycardia  Elevated blood pressure No prior history of hypertension Blood pressure was significantly elevated at presentation and gradually downtrending after she was started on Coreg 3.125 mg twice daily and amlodipine 5 mg daily. Heart rate elevated on ambulation but improving at rest.  Blood pressure is still up Chest x-ray this morning showed some vascular congestion. Echocardiogram done yesterday 11/21 showed EF 55 to 60%, no WMA, mild LVH, RV size mildly enlarged with normal systolic.. I will give 1 dose of Lasix 20 mg IV. Continue IV hydralazine as needed for blood pressure control  New onset diabetes mellitus H/o gestational diabetes A1c 8.1 on 11/24  PTA meds-none Currently on SSI/Accu-Cheks Will plan for oral agents prior to discharge. Recent Labs  Lab 10/11/23 0603 10/11/23 1103 10/11/23 1619 10/12/23 0552 10/12/23 1112  GLUCAP 115* 167* 149* 129*  233*   Acute gastroenteritis  Had NVD, poor oral intake for 4 days Improved with tabetic management. Encourage oral hydration. Currently able to tolerate regular diet. Continue to monitor GI symptoms  Elevated liver enzymes  Hepatic steatosis AST/ALT/total bili was elevated.  Gradually downtrending CT abdomen showed  hepatic steatosis. Continue to monitor Recent Labs  Lab 10/09/23 1752 10/09/23 1825 10/09/23 1856 10/10/23 1413 10/11/23 0314 10/12/23 0303  AST  --  68*  --   --  30  --   ALT  --  148*  --   --  66*  --   ALKPHOS  --  122  --   --  113  --   BILITOT  --  1.9*  --   --  1.3*  --   PROT  --  7.4  --   --  6.0*  --   ALBUMIN  --  3.4*  --   --  2.0*  --   INR  --   --  1.4*  --   --   --   PLT 266  --   --  261 232 262   Elevated creatinine Creatinine was elevated to 1.26.  No baseline available.  Continue to monitor Recent Labs    10/09/23 1825 10/10/23 1413 10/11/23 0314 10/12/23 0303  BUN 32* 16 10 10   CREATININE 1.26* 0.99 0.72 0.68   Hyponatremia Sodium level low likely due to nausea, vomiting, diarrhea  Continue to monitor Recent Labs  Lab 10/09/23 1825 10/09/23 2059 10/10/23 1413 10/11/23 0314 10/12/23 0303  NA 129* 130* 133* 131* 132*   Mobility: Encourage ambulation  Goals of care   Code Status: Full Code    DVT prophylaxis:  Place and maintain sequential compression device Start: 10/11/23 0901   Antimicrobials: IV Rocephin, IV azithromycin Fluid: None Consultants: None Family Communication: None at bedside  Status: Inpatient Level of care:  Progressive   Patient is from: Home Needs to continue in-hospital care: Gradually improving. Anticipated d/c to: Hopefully home in 1 to 2 days    Diet:  Diet Order             Diet regular Room service appropriate? Yes; Fluid consistency: Thin  Diet effective now                   Scheduled Meds:  amLODipine  5 mg Oral Daily   azithromycin  500 mg Oral Daily   benzonatate  100 mg Oral TID   carvedilol  3.125 mg Oral BID WC   insulin aspart  0-5 Units Subcutaneous QHS   insulin aspart  0-9 Units Subcutaneous TID WC   potassium chloride  40 mEq Oral Once    PRN meds: acetaminophen **OR** acetaminophen, albuterol, guaiFENesin-dextromethorphan, hydrALAZINE, oxyCODONE   Infusions:    cefTRIAXone (ROCEPHIN)  IV 2 g (10/11/23 1757)    Antimicrobials: Anti-infectives (From admission, onward)    Start     Dose/Rate Route Frequency Ordered Stop   10/11/23 1200  azithromycin (ZITHROMAX) tablet 500 mg        500 mg Oral Daily 10/11/23 1110 10/14/23 0959   10/09/23 1800  cefTRIAXone (ROCEPHIN) 2 g in sodium chloride 0.9 % 100 mL IVPB        2 g 200 mL/hr over 30 Minutes Intravenous Every 24 hours 10/09/23 1758 10/14/23 1759   10/09/23 1800  azithromycin (ZITHROMAX) 500 mg in sodium chloride 0.9 % 250 mL IVPB  Status:  Discontinued  500 mg 250 mL/hr over 60 Minutes Intravenous Every 24 hours 10/09/23 1758 10/11/23 1110       Objective: Vitals:   10/12/23 0804 10/12/23 1115  BP: (!) 172/94 (!) 174/99  Pulse: (!) 105 (!) 113  Resp: 19 19  Temp: 98 F (36.7 C)   SpO2: 98% 96%    Intake/Output Summary (Last 24 hours) at 10/12/2023 1415 Last data filed at 10/12/2023 1000 Gross per 24 hour  Intake 560 ml  Output --  Net 560 ml   Filed Weights   10/10/23 1416 10/11/23 0420 10/12/23 0400  Weight: 65.1 kg 67.2 kg 63.4 kg   Weight change: -1.732 kg Body mass index is 26.4 kg/m.   Physical Exam: General exam: Pleasant middle-aged female.  Gradually improving Skin: No rashes, lesions or ulcers. HEENT: Atraumatic, normocephalic, no obvious bleeding Lungs: Clear to auscultation bilaterally.  Still coughs on deep breathing CVS: Tachycardic, no murmur GI/Abd soft, nontender, nondistended, bowel sound present CNS: Alert, awake, oriented x 3 Psychiatry: Mood appropriate Extremities: No pedal edema, no calf tenderness  Data Review: I have personally reviewed the laboratory data and studies available.  F/u labs ordered Unresulted Labs (From admission, onward)     Start     Ordered   10/11/23 1334  Mycoplasma pneumoniae antibody, IgM  Add-on,   AD       Question:  Specimen collection method  Answer:  Lab=Lab collect   10/11/23 1333            Total  time spent in review of labs and imaging, patient evaluation, formulation of plan, documentation and communication with family: 45 minutes  Signed, Lorin Glass, MD [facility] 10/12/2023

## 2023-10-12 NOTE — Progress Notes (Signed)
Mobility Specialist Progress Note:   10/12/23 1030  Mobility  Activity Ambulated with assistance in hallway  Level of Assistance Standby assist, set-up cues, supervision of patient - no hands on  Assistive Device None;Other (Comment) (hallway handrails)  Distance Ambulated (ft) 80 ft  Activity Response Tolerated fair  Mobility Referral Yes  $Mobility charge 1 Mobility  Mobility Specialist Start Time (ACUTE ONLY) 1030  Mobility Specialist Stop Time (ACUTE ONLY) 1041  Mobility Specialist Time Calculation (min) (ACUTE ONLY) 11 min   Pt agreeable to mobility session. Required no physical assistance. Held onto hallway rails occasionally. HR up to 117bpm, SpO2 96% on RA. Pt back in bed with all needs met, MD in room.   Addison Lank Mobility Specialist Please contact via SecureChat or  Rehab office at [phone]

## 2023-10-13 ENCOUNTER — Other Ambulatory Visit (HOSPITAL_COMMUNITY): Payer: Self-pay

## 2023-10-13 LAB — BASIC METABOLIC PANEL
Anion gap: 12 (ref 5–15)
BUN: 11 mg/dL (ref 6–20)
CO2: 21 mmol/L — ABNORMAL LOW (ref 22–32)
Calcium: 8.3 mg/dL — ABNORMAL LOW (ref 8.9–10.3)
Chloride: 100 mmol/L (ref 98–111)
Creatinine, Ser: 0.67 mg/dL (ref 0.44–1.00)
GFR, Estimated: >60 mL/min (ref >60)
Glucose, Bld: 165 mg/dL — ABNORMAL HIGH (ref 70–99)
Potassium: 3.4 mmol/L — ABNORMAL LOW (ref 3.5–5.1)
Sodium: 133 mmol/L — ABNORMAL LOW (ref 135–145)

## 2023-10-13 LAB — GLUCOSE, CAPILLARY
Glucose-Capillary: 135 mg/dL — ABNORMAL HIGH (ref 70–99)
Glucose-Capillary: 255 mg/dL — ABNORMAL HIGH (ref 70–99)

## 2023-10-13 LAB — CBC
HCT: 40.6 % (ref 36.0–46.0)
Hemoglobin: 13.9 g/dL (ref 12.0–15.0)
MCH: 30.2 pg (ref 26.0–34.0)
MCHC: 34.2 g/dL (ref 30.0–36.0)
MCV: 88.3 fL (ref 80.0–100.0)
Platelets: 385 10*3/uL (ref 150–400)
RBC: 4.6 MIL/uL (ref 3.87–5.11)
RDW: 14.8 % (ref 11.5–15.5)
WBC: 8.4 10*3/uL (ref 4.0–10.5)
nRBC: 0 % (ref 0.0–0.2)

## 2023-10-13 MED ORDER — GLIPIZIDE 5 MG PO TABS
2.5000 mg | ORAL_TABLET | Freq: Every day | ORAL | 0 refills | Status: DC
  Filled 2023-10-13: qty 15, 30d supply, fill #0

## 2023-10-13 MED ORDER — LIDOCAINE 4 % EX PTCH
1.0000 | MEDICATED_PATCH | Freq: Every day | CUTANEOUS | 0 refills | Status: AC | PRN
  Filled 2023-10-13: qty 30, 30d supply, fill #0

## 2023-10-13 MED ORDER — AMOXICILLIN-POT CLAVULANATE 875-125 MG PO TABS
1.0000 | ORAL_TABLET | Freq: Two times a day (BID) | ORAL | 0 refills | Status: AC
  Filled 2023-10-13: qty 6, 3d supply, fill #0

## 2023-10-13 MED ORDER — BLOOD GLUCOSE TEST VI STRP
1.0000 | ORAL_STRIP | Freq: Three times a day (TID) | 0 refills | Status: AC
  Filled 2023-10-13: qty 100, 30d supply, fill #0

## 2023-10-13 MED ORDER — LOPERAMIDE HCL 2 MG PO CAPS: 2.0000 mg | ORAL_CAPSULE | Freq: Four times a day (QID) | ORAL | Status: DC | PRN

## 2023-10-13 MED ORDER — LIVING WELL WITH DIABETES BOOK
Freq: Once | Status: AC
  Filled 2023-10-13: qty 1

## 2023-10-13 MED ORDER — METFORMIN HCL 500 MG PO TABS
500.0000 mg | ORAL_TABLET | Freq: Two times a day (BID) | ORAL | 0 refills | Status: DC
  Filled 2023-10-13: qty 60, 30d supply, fill #0

## 2023-10-13 MED ORDER — LANCETS MISC
1.0000 | Freq: Three times a day (TID) | 0 refills | Status: AC
  Filled 2023-10-13: qty 100, 30d supply, fill #0

## 2023-10-13 MED ORDER — ACETAMINOPHEN 500 MG PO TABS
1000.0000 mg | ORAL_TABLET | Freq: Three times a day (TID) | ORAL | 0 refills | Status: AC
  Filled 2023-10-13: qty 42, 7d supply, fill #0

## 2023-10-13 MED ORDER — CARVEDILOL 3.125 MG PO TABS
3.1250 mg | ORAL_TABLET | Freq: Two times a day (BID) | ORAL | 0 refills | Status: DC
  Filled 2023-10-13: qty 60, 30d supply, fill #0

## 2023-10-13 MED ORDER — GUAIFENESIN-DM 100-10 MG/5ML PO SYRP
5.0000 mL | ORAL_SOLUTION | Freq: Four times a day (QID) | ORAL | 0 refills | Status: AC | PRN
  Filled 2023-10-13: qty 118, 6d supply, fill #0

## 2023-10-13 MED ORDER — LOSARTAN POTASSIUM 50 MG PO TABS
50.0000 mg | ORAL_TABLET | Freq: Every day | ORAL | 0 refills | Status: DC
  Filled 2023-10-13: qty 30, 30d supply, fill #0

## 2023-10-13 MED ORDER — BLOOD GLUCOSE MONITOR SYSTEM W/DEVICE KIT
1.0000 | PACK | Freq: Three times a day (TID) | 0 refills | Status: AC
  Filled 2023-10-13: qty 1, 30d supply, fill #0

## 2023-10-13 NOTE — Progress Notes (Signed)
Notified Dr. Pola Corn that patient complains of Diarrhea since Sunday. Order received for PRN Immodium. Patient instructed to call if she has any further episodes of diarrhea. Patient states that she has not had any bowel movements today.

## 2023-10-13 NOTE — Progress Notes (Signed)
Mobility Specialist Progress Note:    10/13/23 1126  Mobility  Activity Ambulated with assistance in hallway  Level of Assistance Standby assist, set-up cues, supervision of patient - no hands on  Assistive Device None  Distance Ambulated (ft) 100 ft  Activity Response Tolerated well  Mobility Referral Yes  $Mobility charge 1 Mobility  Mobility Specialist Start Time (ACUTE ONLY) 0930  Mobility Specialist Stop Time (ACUTE ONLY) 0945  Mobility Specialist Time Calculation (min) (ACUTE ONLY) 15 min   Pt received in bed agreeable to mobility. No physical assistance needed during session. Pt Hr stated between 110-117 throughout session. No c/o during ambulation. Returned to room w/o fault. Situated in bed w/ call bell and personal belongings in reach.   Pre Mobility 95 During Mobility- 110-117 Post Mobility- 95  Thompson Grayer Mobility Specialist  Please contact vis Secure Chat or  Rehab Office [phone]

## 2023-10-13 NOTE — Inpatient Diabetes Management (Signed)
Inpatient Diabetes Program Recommendations  AACE/ADA: New Consensus Statement on Inpatient Glycemic Control (2015)  Target Ranges:  Prepandial:   less than 140 mg/dL      Peak postprandial:   less than 180 mg/dL (1-2 hours)      Critically ill patients:  140 - 180 mg/dL   Lab Results  Component Value Date   GLUCAP 255 (H) 10/13/2023   HGBA1C 8.1 (H) 10/11/2023    Review of Glycemic Control  Latest Reference Range & Units 10/13/23 06:38 10/13/23 11:21  Glucose-Capillary 70 - 99 mg/dL 960 (H) 454 (H)   Diabetes history: New Dx. Of DM Outpatient Diabetes medications: None Current orders for Inpatient glycemic control:  Novolog sensitive tid with meals and HS  Inpatient Diabetes Program Recommendations:    Call received from RN regarding patient and new diagnosis of DM.  Per RN patient has been very tearful regarding new diagnosis. DM coordinator is working remotely so I called patient by phone to discuss further.   Spoke with pt about new diagnosis.  Discussed A1C results with her and explained what an A1C is, basic pathophysiology of DM Type 2, basic home care, importance of checking CBGs and maintaining good CBG control to prevent long-term and short-term complications.  Also ordered DM educational booklet.  We briefly discussed dietary changes such as eliminating sugar from beverages and reducing serving size of CHO's.  Patient states that she does not have PCP but husband has helped her find new doctor.  I reinforced need for close follow up and monitoring.  Will ask MD to order glucometer at discharge as well.   Thanks  Beryl Meager, RN, BC-ADM Inpatient Diabetes Coordinator Pager [phone]  (8a-5p)

## 2023-10-13 NOTE — Progress Notes (Signed)
Paged by nurse, who requests visit as patient has been sobbing nonstop since yesterday and won't talk with staff. Chaplain attempts visit. Pt declines. Chaplain updates nurse.

## 2023-10-13 NOTE — Progress Notes (Signed)
Entered room to find patient sitting up and sobbing in bed. BP is 197/107. Patient refuses to lay back or to talk to RN about what is upsetting her or how we can help. Day shift RN from yesterday and night shift RN report that patient has been sobbing intermittently through their shifts as well. Reported above to Dr. Pola Corn. No further orders received at this time. Paged chaplain and entered consult order.

## 2023-10-13 NOTE — Care Management (Signed)
MATCH information sent to [facility] pharmacy

## 2023-10-13 NOTE — Progress Notes (Signed)
Patient discharged to home via wheelchair by SWOT RN at 1435 on 10/13/23.

## 2023-10-13 NOTE — Plan of Care (Signed)

## 2023-10-13 NOTE — Progress Notes (Signed)
Patient and spouse have received discharge instructions to include follow up appointment, new medications, and blood sugar checks. Patient and spouse verbalizes understanding of discharge instructions.

## 2023-10-13 NOTE — Discharge Summary (Signed)
Physician Discharge Summary  Angelica Steele KYH:[id number] DOB: 07-20-1976 DOA: 10/09/2023  PCP: Patient, No Pcp Per  Admit date: 10/09/2023 Discharge date: 10/13/2023  Admitted From: Home Discharge disposition: Home  Recommendations at discharge:  Complete the course of antibiotics with 3 more days of Augmentin Mucinex for cough.  Lidocaine patch for pain You have been started on 2 different blood pressure medications -carvedilol and losartan. You had been started on 2 different medications for diabetes -metformin and glipizide.  Monitor blood sugar at home and follow-up with primary care provider Follow-up with primary care provider in 1 to 2 weeks   Brief narrative: Angelica Steele is a 47 y.o. female with PMH significant for gestational diabetes, anemia 11/19, patient presented [facility] from [facility] with complaint of high fever of 104, cough, nausea, vomiting, diarrhea for 4 days.  She started feeling sick last Wednesday.  In the next 3 days, she developed fever up to 103, cough, fatigue, diarrhea.  She was unable to hold anything down.  She tried Tylenol at home without relief and ultimately presented to ED on 11/19.  In the ED, patient had a fever of 103, heart rate initially was elevated to 154, blood pressure in 140s, breathing on room air. Initial labs showed WBC count elevated to 30,000, sodium low at 129, glucose elevated to 86, BUN/creatinine elevated to 32/1.26, AST, ALT elevated to 68/148, bilirubin of 1.9 lactic acid was initially elevated to 3.3. Urinalysis showed hazy yellow urine with 100 glucose, no protein, rare bacteria Respiratory virus panel unremarkable CT chest showed right lower lobe consolidation with scattered airspace disease in the right lower lobe and left upper lobe, consistent with multifocal pneumonia. No acute process and CT abdomen and pelvis.  Noted to have hepatic steatosis and uterine fibroid. VBG showed pH alkalotic at 7.43, HCO3 23 Blood culture was  sent Patient was started on IV Rocephin, IV azithromycin Accepted to [facility].  Subjective: Patient was seen and examined this morning. Sitting up in bed.  Not in distress.  Feels physically better.  Feels emotional.  Would not tell why. Husband on the phone. Able to ambulate in the hallways without supplemental oxygen.  O2 sat consistently more than 95%.  Tachycardic to 110s on ambulation  Hospital course: Severe sepsis Multifocal pneumonia Presented with fever, shortness of breath,  Found to have tachycardia, leukocytosis, multifocal pneumonia on chest x-ray  lactic acid level was elevated as high as 4.1 but patient was not hemodynamically compromised. Resp virus panel unremarkable. mycoplasma IgM antibody level pending. Blood culture did not show any growth. Patient was started on IV Rocephin and azithromycin. Currently continued on the same. No recurrent fever in last 24 hours. WBC count appropriately trending down. Lactic acid normalized. Discharged on 3 more days of antibiotics with Augmentin Continue Mucinex for cough. Complains of pleuritic pain on the right chest wall every time she coughs.  Skin Tylenol and lidocaine patch will help Recent Labs  Lab 10/09/23 1752 10/09/23 1942 10/10/23 0751 10/10/23 1050 10/10/23 1413 10/10/23 2011 10/11/23 0314 10/12/23 0303 10/13/23 0822  WBC 29.9*  --   --   --  22.9*  --  20.6* 16.2* 8.4  LATICACIDVEN 3.3*   < > 4.1* 2.8* 3.6* 1.4  --  1.1  --    < > = values in this interval not displayed.   Sinus tachycardia  Elevated blood pressure No prior history of hypertension This is a pleasant, patient's heart rate and blood pressure  were both elevated. Partly due to infection.  They are improving with improvement in infection. I also believe patient had undiagnosed underlying hypertension.  Echocardiogram showed EF normal at 55 to 60%, She has been started on blood pressure medicines. I will discharge her on Coreg 3.25  mg twice daily and losartan 50 mg daily Continue follow-up PCP as an outpatient  New onset diabetes mellitus H/o gestational diabetes A1c 8.1 on 11/24  PTA meds-none Start metformin and glipizide at discharge Recent Labs  Lab 10/12/23 1112 10/12/23 1605 10/12/23 2137 10/13/23 0638 10/13/23 1121  GLUCAP 233* 176* 223* 135* 255*   Acute gastroenteritis  Had NVD, poor oral intake for 4 days Improved with symptomatic management. Encourage oral hydration. Currently able to tolerate regular diet.  Elevated liver enzymes  Hepatic steatosis AST/ALT/total bili was elevated.  Gradually downtrending CT abdomen showed hepatic steatosis. Continue to monitor as an outpatient Recent Labs  Lab 10/09/23 1752 10/09/23 1825 10/09/23 1856 10/10/23 1413 10/11/23 0314 10/12/23 0303 10/13/23 0822  AST  --  68*  --   --  30  --   --   ALT  --  148*  --   --  66*  --   --   ALKPHOS  --  122  --   --  113  --   --   BILITOT  --  1.9*  --   --  1.3*  --   --   PROT  --  7.4  --   --  6.0*  --   --   ALBUMIN  --  3.4*  --   --  2.0*  --   --   INR  --   --  1.4*  --   --   --   --   PLT 266  --   --  261 232 262 385   AKI Creatinine was elevated to 1.26 on presentation.  Improved down to normal with hydration Recent Labs    10/09/23 1825 10/10/23 1413 10/11/23 0314 10/12/23 0303 10/13/23 0822  BUN 32* 16 10 10 11   CREATININE 1.26* 0.99 0.72 0.68 0.67   Hyponatremia Sodium level low likely due to nausea, vomiting, diarrhea   Gradually improving.  Continue to monitor Recent Labs  Lab 10/09/23 1825 10/09/23 2059 10/10/23 1413 10/11/23 0314 10/12/23 0303 10/13/23 0822  NA 129* 130* 133* 131* 132* 133*   Mobility: Encourage ambulation  Goals of care   Code Status: Full Code   Wounds:  -    Discharge Exam:   Vitals:   10/13/23 0501 10/13/23 0733 10/13/23 0857 10/13/23 1122  BP: (!) 160/85 (!) 197/107 (!) 158/87 (!) 148/86  Pulse: 82 (!) 118 95 95  Resp: 20 20 18  18   Temp: (!) 97.5 F (36.4 C) 97.6 F (36.4 C) 97.7 F (36.5 C) 97.7 F (36.5 C)  TempSrc: Oral Oral Oral Oral  SpO2: 96% 95% 96% 96%  Weight: 66.3 kg     Height:        Body mass index is 27.62 kg/m.   General exam: Pleasant middle-aged female.  Not in physical pain Skin: No rashes, lesions or ulcers. HEENT: Atraumatic, normocephalic, no obvious bleeding Lungs: Clear to auscultation bilaterally.  Still coughs on deep breathing CVS: Tachycardic, no murmur GI/Abd soft, nontender, nondistended, bowel sound present CNS: Alert, awake, oriented x 3 Psychiatry: Emotional Extremities: No pedal edema, no calf tenderness  Follow ups:    Follow-up Information     [facility] Follow up on 10/25/2023.   Specialty: Family Medicine Why: @2 :30pm please arrive @2 :15pm Contact information: [address] [place] [facility] [address] [phone]                Discharge Instructions:   Discharge Instructions     Call MD for:  difficulty breathing, headache or visual disturbances   Complete by: As directed    Call MD for:  extreme fatigue   Complete by: As directed    Call MD for:  hives   Complete by: As directed    Call MD for:  persistant dizziness or light-headedness   Complete by: As directed    Call MD for:  persistant nausea and vomiting   Complete by: As directed    Call MD for:  severe uncontrolled pain   Complete by: As directed    Call MD for:  temperature >100.4   Complete by: As directed    Diet - low sodium heart healthy   Complete by: As directed    Diet Carb Modified   Complete by: As directed    Discharge instructions   Complete by: As directed    Recommendations at discharge:   Complete the course of antibiotics with 3 more days of Augmentin  Mucinex for cough.  Lidocaine patch for pain  You have been started on 2 different blood pressure medications -carvedilol and losartan.  You had been started  on 2 different medications for diabetes -metformin and glipizide.  Monitor blood sugar at home and follow-up with primary care provider  Follow-up with primary care provider in 1 to 2 weeks  Discharge instructions for diabetes mellitus: Check blood sugar 3 times a day and bedtime at home. If blood sugar running above 200 or less than 70 please call your MD to adjust insulin. If you notice signs and symptoms of hypoglycemia (low blood sugar) like jitteriness, confusion, thirst, tremor and sweating, please check blood sugar, drink sugary drink/biscuits/sweets to increase sugar level and call MD or return to ER.    General discharge instructions: Follow with Primary MD Patient, No Pcp Per in 7 days  Please request your PCP  to go over your hospital tests, procedures, radiology results at the follow up. Please get your medicines reviewed and adjusted.  Your PCP may decide to repeat certain labs or tests as needed. Do not drive, operate heavy machinery, perform activities at heights, swimming or participation in water activities or provide baby sitting services if your were admitted for syncope or siezures until you have seen by Primary MD or a Neurologist and advised to do so again. [place] Controlled Substance Reporting System database was reviewed. Do not drive, operate heavy machinery, perform activities at heights, swim, participate in water activities or provide baby-sitting services while on medications for pain, sleep and mood until your outpatient physician has reevaluated you and advised to do so again.  You are strongly recommended to comply with the dose, frequency and duration of prescribed medications. Activity: As tolerated with Full fall precautions use walker/cane & assistance as needed Avoid using any recreational substances like cigarette, tobacco, alcohol, or non-prescribed drug. If you experience worsening of your admission symptoms, develop shortness of breath, life  threatening emergency, suicidal or homicidal thoughts you must seek medical attention immediately by calling 911 or calling your MD immediately  if symptoms less severe. You must read complete instructions/literature along with all the possible adverse reactions/side effects for all the medicines you  take and that have been prescribed to you. Take any new medicine only after you have completely understood and accepted all the possible adverse reactions/side effects.  Wear Seat belts while driving. You were cared for by a hospitalist during your hospital stay. If you have any questions about your discharge medications or the care you received while you were in the hospital after you are discharged, you can call the unit and ask to speak with the hospitalist or the covering physician. Once you are discharged, your primary care physician will handle any further medical issues. Please note that NO REFILLS for any discharge medications will be authorized once you are discharged, as it is imperative that you return to your primary care physician (or establish a relationship with a primary care physician if you do not have one).   Increase activity slowly   Complete by: As directed        Discharge Medications:   Allergies as of 10/13/2023   No Known Allergies      Medication List     STOP taking these medications    HYDROcodone-acetaminophen 5-325 MG tablet Commonly known as: NORCO/VICODIN   ibuprofen 600 MG tablet Commonly known as: ADVIL   TYLENOL COUGH/SORE THROAT PO       TAKE these medications    acetaminophen 500 MG tablet Commonly known as: TYLENOL Take 2 tablets (1,000 mg total) by mouth every 8 (eight) hours for 7 days.   amoxicillin-clavulanate 875-125 MG tablet Commonly known as: AUGMENTIN Take 1 tablet by mouth 2 (two) times daily for 3 days.   Blood Glucose Monitoring Suppl Devi 1 each by Does not apply route 3 (three) times daily. May dispense any manufacturer  covered by patient's insurance.   BLOOD GLUCOSE TEST STRIPS Strp 1 each by Does not apply route 3 (three) times daily. Use as directed to check blood sugar. May dispense any manufacturer covered by patient's insurance and fits patient's device.   carvedilol 3.125 MG tablet Commonly known as: COREG Take 1 tablet (3.125 mg total) by mouth 2 (two) times daily with a meal.   glipiZIDE 2.5 MG Tabs Take 2.5 mg by mouth daily before breakfast.   guaiFENesin-dextromethorphan 100-10 MG/5ML syrup Commonly known as: ROBITUSSIN DM Take 5 mLs by mouth every 6 (six) hours as needed for cough.   Lancets Misc 1 each by Does not apply route 3 (three) times daily. Use as directed to check blood sugar. May dispense any manufacturer covered by patient's insurance and fits patient's device.   lidocaine 4 % Place 1 patch onto the skin daily as needed (pain).   losartan 50 MG tablet Commonly known as: Cozaar Take 1 tablet (50 mg total) by mouth daily.   metFORMIN 500 MG tablet Commonly known as: GLUCOPHAGE Take 1 tablet (500 mg total) by mouth 2 (two) times daily with a meal.         The results of significant diagnostics from this hospitalization (including imaging, microbiology, ancillary and laboratory) are listed below for reference.    Procedures and Diagnostic Studies:   CT CHEST ABDOMEN PELVIS W CONTRAST  Result Date: 10/09/2023 CLINICAL DATA:  Sepsis. Nausea, vomiting, and diarrhea. Fever and dry cough. EXAM: CT CHEST, ABDOMEN, AND PELVIS WITH CONTRAST TECHNIQUE: Multidetector CT imaging of the chest, abdomen and pelvis was performed following the standard protocol during bolus administration of intravenous contrast. RADIATION DOSE REDUCTION: This exam was performed according to the departmental dose-optimization program which includes automated exposure control, adjustment of the mA  and/or kV according to patient size and/or use of iterative reconstruction technique. CONTRAST:  85mL  OMNIPAQUE IOHEXOL 300 MG/ML  SOLN COMPARISON:  03/13/2014. FINDINGS: CT CHEST FINDINGS Cardiovascular: The heart is enlarged and there is no pericardial effusion. The aorta and pulmonary trunk are normal in caliber. Mediastinum/Nodes: No mediastinal, hilar, or axillary lymphadenopathy. The thyroid gland, trachea, and esophagus are within normal limits. Lungs/Pleura: Scattered airspace opacities are noted in the perihilar region in the left upper lobe. There is consolidation with a associated airspace disease in the left lower lobe. No effusion or pneumothorax is seen. Musculoskeletal: Degenerative changes are present in the thoracic spine. No acute osseous abnormality. CT ABDOMEN PELVIS FINDINGS Hepatobiliary: No focal liver abnormality is seen. Fatty infiltration of the liver is noted. No gallstones, gallbladder wall thickening, or biliary dilatation. Pancreas: Unremarkable. No pancreatic ductal dilatation or surrounding inflammatory changes. Spleen: Normal in size without focal abnormality. Adrenals/Urinary Tract: The adrenal glands are within normal limits. The kidneys enhance symmetrically. No renal calculus or hydronephrosis bilaterally. The bladder is nondistended. Stomach/Bowel: Stomach is within normal limits. Appendix is surgically absent. No evidence of bowel wall thickening, distention, or inflammatory changes. No free air or pneumatosis. Vascular/Lymphatic: Aortic atherosclerosis. No enlarged abdominal or pelvic lymph nodes. Reproductive: A uterine fibroid is noted on the right. No adnexal mass. Other: No abdominopelvic ascites. Musculoskeletal: Degenerative changes are present in the lumbar spine. No acute osseous abnormality. IMPRESSION: 1. Right lower lobe consolidation with scattered airspace disease in the right lower lobe and left upper lobe, consistent with multifocal pneumonia. 2. No acute process in the abdomen and pelvis 3. Hepatic steatosis. 4. Uterine fibroid. 5. Aortic atherosclerosis.  Electronically Signed   By: Thornell Sartorius M.D.   On: 10/09/2023 21:36   DG Chest Port 1 View  Result Date: 10/09/2023 CLINICAL DATA:  Possible sepsis. Nausea, vomiting, and diarrhea. Fever and dry cough. EXAM: PORTABLE CHEST 1 VIEW COMPARISON:  None Available. FINDINGS: The heart is enlarged and mediastinal contours are within normal limits. Focal consolidation is noted in the infrahilar region on the right. Mild airspace disease is noted in the perihilar region on the left. No effusion or pneumothorax bilaterally. No acute osseous abnormality. IMPRESSION: 1. Focal consolidation in the infrahilar region on the right and mild perihilar airspace disease on the left, suggesting multifocal pneumonia. 2. Cardiomegaly. Electronically Signed   By: Thornell Sartorius M.D.   On: 10/09/2023 21:27     Labs:   Basic Metabolic Panel: Recent Labs  Lab 10/09/23 1825 10/09/23 2059 10/10/23 1413 10/11/23 0314 10/12/23 0303 10/13/23 0822  NA 129* 130* 133* 131* 132* 133*  K 3.6 3.5 3.7 3.5 3.3* 3.4*  CL 92*  --  97* 99 101 100  CO2 23  --  22 23 22  21*  GLUCOSE 286*  --  192* 150* 147* 165*  BUN 32*  --  16 10 10 11   CREATININE 1.26*  --  0.99 0.72 0.68 0.67  CALCIUM 10.0  --  8.9 8.0* 8.0* 8.3*   GFR Estimated Creatinine Clearance: 74.1 mL/min (by C-G formula based on SCr of 0.67 mg/dL). Liver Function Tests: Recent Labs  Lab 10/09/23 1825 10/11/23 0314  AST 68* 30  ALT 148* 66*  ALKPHOS 122 113  BILITOT 1.9* 1.3*  PROT 7.4 6.0*  ALBUMIN 3.4* 2.0*   No results for input(s): "LIPASE", "AMYLASE" in the last 168 hours. No results for input(s): "AMMONIA" in the last 168 hours. Coagulation profile Recent Labs  Lab 10/09/23 1856  INR 1.4*    CBC: Recent Labs  Lab 10/09/23 1752 10/09/23 2059 10/10/23 1413 10/11/23 0314 10/12/23 0303 10/13/23 0822  WBC 29.9*  --  22.9* 20.6* 16.2* 8.4  NEUTROABS 27.4*  --   --   --  13.0*  --   HGB 14.2 11.9* 12.1 11.1* 11.7* 13.9  HCT 41.8 35.0*  35.7* 32.8* 34.0* 40.6  MCV 86.5  --  86.7 87.7 90.2 88.3  PLT 266  --  261 232 262 385   Cardiac Enzymes: No results for input(s): "CKTOTAL", "CKMB", "CKMBINDEX", "TROPONINI" in the last 168 hours. BNP: Invalid input(s): "POCBNP" CBG: Recent Labs  Lab 10/12/23 1112 10/12/23 1605 10/12/23 2137 10/13/23 0638 10/13/23 1121  GLUCAP 233* 176* 223* 135* 255*   D-Dimer No results for input(s): "DDIMER" in the last 72 hours. Hgb A1c Recent Labs    10/11/23 0314  HGBA1C 8.1*   Lipid Profile No results for input(s): "CHOL", "HDL", "LDLCALC", "TRIG", "CHOLHDL", "LDLDIRECT" in the last 72 hours. Thyroid function studies Recent Labs    10/10/23 1413  TSH 0.649   Anemia work up No results for input(s): "VITAMINB12", "FOLATE", "FERRITIN", "TIBC", "IRON", "RETICCTPCT" in the last 72 hours. Microbiology Recent Results (from the past 240 hour(s))  Culture, blood (Routine x 2)     Status: None (Preliminary result)   Collection Time: 10/09/23  5:38 PM   Specimen: BLOOD  Result Value Ref Range Status   Specimen Description   Final    BLOOD LEFT ANTECUBITAL Performed at [facility], [address], [place], [place] [address]    Special Requests   Final    Blood Culture results may not be optimal due to an inadequate volume of blood received in culture bottles BOTTLES DRAWN AEROBIC AND ANAEROBIC Performed at [facility], [address], [place], [place] [address]    Culture   Final    NO GROWTH 4 DAYS Performed at [facility], [address], [place], [place] [address]    Report Status PENDING  Incomplete  Culture, blood (Routine x 2)     Status: None (Preliminary result)   Collection Time: 10/09/23  5:43 PM   Specimen: BLOOD  Result Value Ref Range Status   Specimen Description   Final    BLOOD BLOOD LEFT HAND Performed at [facility], [address], [place], [place] [address]    Special Requests   Final     Blood Culture adequate volume BOTTLES DRAWN AEROBIC AND ANAEROBIC Performed at [facility], [address] [place], [place] [address]    Culture   Final    NO GROWTH 4 DAYS Performed at [facility], [address] [place] [address], [place], [place] [address]    Report Status PENDING  Incomplete  Resp panel by RT-PCR (RSV, Flu A&B, Covid) Anterior Nasal Swab     Status: None   Collection Time: 10/09/23  6:25 PM   Specimen: Anterior Nasal Swab  Result Value Ref Range Status   SARS Coronavirus 2 by RT PCR NEGATIVE NEGATIVE Final    Comment: (NOTE) SARS-CoV-2 target nucleic acids are NOT DETECTED.  The SARS-CoV-2 RNA is generally detectable in upper respiratory specimens during the acute phase of infection. The lowest concentration of SARS-CoV-2 viral copies this assay can detect is 138 copies/mL. A negative result does not preclude SARS-Cov-2 infection and should not be used as the sole basis for treatment or other patient management decisions. A negative result may occur with  improper specimen collection/handling, submission of specimen other than nasopharyngeal swab, presence of viral mutation(s) within the areas targeted by this assay, and inadequate number of viral copies(<138 copies/mL). A negative result must be combined with clinical observations, patient history, and epidemiological information. The expected result is Negative.  Fact Sheet for Patients:  BloggerCourse.com  Fact Sheet for Healthcare Providers:  SeriousBroker.it  This test is no t yet approved or cleared by the [place] FDA and  has been authorized for detection and/or diagnosis of SARS-CoV-2 by FDA under an Emergency Use Authorization (EUA). This EUA will remain  in effect (meaning this test can be used) for the duration of the COVID-19 declaration under Section 564(b)(1) of the Act, 21 U.S.C.section 360bbb-3(b)(1), unless the  authorization is terminated  or revoked sooner.       Influenza A by PCR NEGATIVE NEGATIVE Final   Influenza B by PCR NEGATIVE NEGATIVE Final    Comment: (NOTE) The Xpert Xpress SARS-CoV-2/FLU/RSV plus assay is intended as an aid in the diagnosis of influenza from Nasopharyngeal swab specimens and should not be used as a sole basis for treatment. Nasal washings and aspirates are unacceptable for Xpert Xpress SARS-CoV-2/FLU/RSV testing.  Fact Sheet for Patients: BloggerCourse.com  Fact Sheet for Healthcare Providers: SeriousBroker.it  This test is not yet approved or cleared by the [place] FDA and has been authorized for detection and/or diagnosis of SARS-CoV-2 by FDA under an Emergency Use Authorization (EUA). This EUA will remain in effect (meaning this test can be used) for the duration of the COVID-19 declaration under Section 564(b)(1) of the Act, 21 U.S.C. section 360bbb-3(b)(1), unless the authorization is terminated or revoked.     Resp Syncytial Virus by PCR NEGATIVE NEGATIVE Final    Comment: (NOTE) Fact Sheet for Patients: BloggerCourse.com  Fact Sheet for Healthcare Providers: SeriousBroker.it  This test is not yet approved or cleared by the [place] FDA and has been authorized for detection and/or diagnosis of SARS-CoV-2 by FDA under an Emergency Use Authorization (EUA). This EUA will remain in effect (meaning this test can be used) for the duration of the COVID-19 declaration under Section 564(b)(1) of the Act, 21 U.S.C. section 360bbb-3(b)(1), unless the authorization is terminated or revoked.  Performed at [facility], [address] [place], [place] [address]     Time coordinating discharge: 55 minutes  Signed: Lorin Glass  [facility] 10/13/2023, 1:03 PM

## 2023-10-14 LAB — CULTURE, BLOOD (ROUTINE X 2)
Culture: NO GROWTH
Culture: NO GROWTH
Special Requests: ADEQUATE

## 2023-10-15 LAB — MYCOPLASMA PNEUMONIAE ANTIBODY, IGM: Mycoplasma pneumo IgM: <770 U/mL (ref 0–769)

## 2023-10-24 ENCOUNTER — Telehealth (INDEPENDENT_AMBULATORY_CARE_PROVIDER_SITE_OTHER): Payer: Self-pay | Admitting: Primary Care

## 2023-10-24 NOTE — Telephone Encounter (Signed)
Called to remind pt about apt. VM left for pt

## 2023-10-25 ENCOUNTER — Encounter (INDEPENDENT_AMBULATORY_CARE_PROVIDER_SITE_OTHER): Payer: Self-pay | Admitting: Primary Care

## 2023-10-25 ENCOUNTER — Other Ambulatory Visit (HOSPITAL_COMMUNITY): Payer: Self-pay

## 2023-10-25 ENCOUNTER — Ambulatory Visit (INDEPENDENT_AMBULATORY_CARE_PROVIDER_SITE_OTHER): Payer: Self-pay | Admitting: Primary Care

## 2023-10-25 VITALS — BP 126/84 | HR 99 | Resp 16 | Ht 61.0 in | Wt 135.6 lb

## 2023-10-25 DIAGNOSIS — I10 Essential (primary) hypertension: Secondary | ICD-10-CM

## 2023-10-25 DIAGNOSIS — E119 Type 2 diabetes mellitus without complications: Secondary | ICD-10-CM

## 2023-10-25 DIAGNOSIS — Z7984 Long term (current) use of oral hypoglycemic drugs: Secondary | ICD-10-CM

## 2023-10-25 DIAGNOSIS — Z1211 Encounter for screening for malignant neoplasm of colon: Secondary | ICD-10-CM

## 2023-10-25 LAB — GLUCOSE, POCT (MANUAL RESULT ENTRY): POC Glucose: 142 mg/dL — AB (ref 70–99)

## 2023-10-25 MED ORDER — CLONIDINE HCL 0.1 MG PO TABS
0.1000 mg | ORAL_TABLET | Freq: Once | ORAL | Status: AC
Start: 2023-10-25 — End: ?

## 2023-10-25 NOTE — Progress Notes (Signed)
Subjective:   Angelica Steele is a 47 y.o. 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 Admit date to the hospital was 10/09/23, patient was discharged from the hospital on 10/13/23, patient was admitted for: community acquired pneumonia .Bp was elevated she did not have her losartan and only picked up carvedilol because that was all the pharmacy gave her for BP  Patient has , No chest pain, No abdominal pain - No Nausea, No new weakness tingling or numbness, No Cough - shortness of breath  T2D- Denies polyuria, polydipsia, polyphasia or vision changes.  Does not check blood sugars at home.   Past Medical History:  Diagnosis Date   Anemia    Diabetes mellitus Gestional Diet Controlled     No Known Allergies  Current Outpatient Medications on File Prior to Visit  Medication Sig Dispense Refill   Blood Glucose Monitoring Suppl (BLOOD GLUCOSE MONITOR SYSTEM) w/Device KIT Use to check blood sugar 3 (three) times daily. 1 kit 0   carvedilol (COREG) 3.125 MG tablet Take 1 tablet (3.125 mg total) by mouth 2 (two) times daily with a meal. 60 tablet 0   glipiZIDE (GLUCOTROL) 5 MG tablet Take 0.5 tablets (2.5 mg total) by mouth daily before breakfast. 15 tablet 0   Glucose Blood (BLOOD GLUCOSE TEST STRIPS) STRP Use as directed 3 (three) times daily. Use as directed to check blood sugar. 100 strip 0   guaiFENesin-dextromethorphan (ROBITUSSIN DM) 100-10 MG/5ML syrup Take 5 mLs by mouth every 6 (six) hours as needed for cough. 118 mL 0   Lancets MISC Use to check blood sugar 3 (three) times daily. 100 each 0   lidocaine 4 % Place 1 patch onto the skin daily as needed (pain). 30 patch 0   losartan (COZAAR) 50 MG tablet Take 1 tablet (50 mg total) by mouth daily. 30 tablet 0   metFORMIN (GLUCOPHAGE) 500 MG tablet Take 1 tablet (500 mg total) by mouth 2 (two) times daily with a meal. 60 tablet 0   Current Facility-Administered Medications on File Prior to Visit   Medication Dose Route Frequency Provider Last Rate Last Admin   bupivacaine (SENSORCAINE-MPF) 0.75 % injection    PRN Collier Flowers, CRNA   15 mg at 06/05/11 0906   ceFAZolin (ANCEF) IVPB 1 g/50 mL premix    PRN Collier Flowers, CRNA   1 g at 06/05/11 0900   fentaNYL (SUBLIMAZE) injection    PRN Collier Flowers, CRNA   20 mcg at 06/05/11 0906   ondansetron (ZOFRAN) injection    PRN Collier Flowers, CRNA   4 mg at 06/05/11 0919   oxytocin (PITOCIN) IV infusion 20 units in LR 1000 mL    PRN Collier Flowers, CRNA   20 Units at 06/05/11 [phone]    Review of System:  Comprehensive ROS Pertinent positive and negative noted in HPI   Objective:  BP (!) 170/123 (BP Location: Right Arm, Patient Position: Sitting, Cuff Size: Normal)   Pulse 99   Resp 16   Ht 5\' 1"  (1.549 m)   Wt 135 lb 9.6 oz (61.5 kg)   LMP 10/10/2023   SpO2 97%   BMI 25.62 kg/m   Filed Weights   10/25/23 1504  Weight: 135 lb 9.6 oz (61.5 kg)    Physical Exam Vitals reviewed.  HENT:     Head: Normocephalic.     Right Ear: Tympanic membrane, ear canal and external ear normal.  Left Ear: Tympanic membrane, ear canal and external ear normal.     Nose: Nose normal.     Mouth/Throat:     Mouth: Mucous membranes are moist.  Eyes:     Extraocular Movements: Extraocular movements intact.     Pupils: Pupils are equal, round, and reactive to light.  Cardiovascular:     Rate and Rhythm: Normal rate.  Pulmonary:     Effort: Pulmonary effort is normal.     Breath sounds: Normal breath sounds.  Abdominal:     General: Bowel sounds are normal.     Palpations: Abdomen is soft.  Musculoskeletal:        General: Normal range of motion.     Cervical back: Normal range of motion.  Skin:    General: Skin is warm and dry.  Neurological:     Mental Status: She is alert and oriented to person, place, and time.  Psychiatric:        Mood and Affect: Mood normal.      Assessment:  [name] was seen  today for hospitalization follow-up.  Diagnoses and all orders for this visit:  Type 2 diabetes mellitus without complication, without long-term current use of insulin (HCC) -     Microalbumin / creatinine urine ratio -     POCT glucose (manual entry)  Essential hypertension -     cloNIDine (CATAPRES) tablet 0.1 mg  Colon cancer screening -     Fecal occult blood, imunochemical    This note has been created with Education officer, environmental. Any transcriptional errors are unintentional.   No follow-ups on file.  Grayce Sessions, NP 10/25/2023, 3:54 PM

## 2023-10-26 ENCOUNTER — Other Ambulatory Visit (HOSPITAL_COMMUNITY): Payer: Self-pay

## 2023-10-26 MED ORDER — LOSARTAN POTASSIUM 50 MG PO TABS
50.0000 mg | ORAL_TABLET | Freq: Every day | ORAL | 1 refills | Status: AC
  Filled 2023-10-26: qty 90, 90d supply, fill #0
  Filled 2024-03-27: qty 90, 90d supply, fill #1

## 2023-10-26 MED ORDER — GLIPIZIDE 5 MG PO TABS
2.5000 mg | ORAL_TABLET | Freq: Every day | ORAL | 1 refills | Status: AC
  Filled 2023-10-26: qty 45, 90d supply, fill #0
  Filled 2023-12-11 – 2024-01-04 (×2): qty 45, 90d supply, fill #1

## 2023-10-26 MED ORDER — CARVEDILOL 3.125 MG PO TABS
3.1250 mg | ORAL_TABLET | Freq: Two times a day (BID) | ORAL | 1 refills | Status: AC
  Filled 2023-10-26: qty 180, 90d supply, fill #0
  Filled 2024-03-27: qty 180, 90d supply, fill #1

## 2023-10-26 MED ORDER — METFORMIN HCL 500 MG PO TABS
500.0000 mg | ORAL_TABLET | Freq: Two times a day (BID) | ORAL | 1 refills | Status: AC
  Filled 2023-10-26: qty 180, 90d supply, fill #0
  Filled 2024-03-27: qty 180, 90d supply, fill #1

## 2023-10-29 ENCOUNTER — Other Ambulatory Visit (HOSPITAL_COMMUNITY): Payer: Self-pay

## 2023-12-11 ENCOUNTER — Other Ambulatory Visit (HOSPITAL_COMMUNITY): Payer: Self-pay

## 2023-12-21 ENCOUNTER — Other Ambulatory Visit (HOSPITAL_COMMUNITY): Payer: Self-pay

## 2024-01-04 ENCOUNTER — Other Ambulatory Visit (HOSPITAL_COMMUNITY): Payer: Self-pay

## 2024-03-27 ENCOUNTER — Other Ambulatory Visit (HOSPITAL_COMMUNITY): Payer: Self-pay

## 2024-04-04 ENCOUNTER — Other Ambulatory Visit (HOSPITAL_COMMUNITY): Payer: Self-pay

## 2024-07-04 ENCOUNTER — Other Ambulatory Visit (HOSPITAL_COMMUNITY): Payer: Self-pay

## 2024-07-04 ENCOUNTER — Other Ambulatory Visit (INDEPENDENT_AMBULATORY_CARE_PROVIDER_SITE_OTHER): Payer: Self-pay | Admitting: Primary Care

## 2024-07-16 ENCOUNTER — Other Ambulatory Visit (HOSPITAL_COMMUNITY): Payer: Self-pay

## 2024-09-10 ENCOUNTER — Encounter: Payer: Self-pay | Admitting: Family

## 2024-11-11 ENCOUNTER — Other Ambulatory Visit: Payer: Self-pay

## 2024-11-11 ENCOUNTER — Other Ambulatory Visit (HOSPITAL_COMMUNITY): Payer: Self-pay

## 2024-11-11 MED ORDER — METFORMIN HCL 500 MG PO TABS
500.0000 mg | ORAL_TABLET | Freq: Two times a day (BID) | ORAL | 0 refills | Status: AC
  Filled 2024-11-11: qty 90, 45d supply, fill #0

## 2024-11-11 MED ORDER — GLIPIZIDE 5 MG PO TABS
2.5000 mg | ORAL_TABLET | Freq: Every day | ORAL | 0 refills | Status: AC
  Filled 2024-11-11: qty 45, 90d supply, fill #0

## 2024-11-11 MED ORDER — FLUTICASONE PROPIONATE 50 MCG/ACT NA SUSP
2.0000 | Freq: Every day | NASAL | 1 refills | Status: AC
  Filled 2024-11-11: qty 16, 30d supply, fill #0

## 2024-11-11 MED ORDER — CARVEDILOL 3.125 MG PO TABS
3.1250 mg | ORAL_TABLET | Freq: Two times a day (BID) | ORAL | 0 refills | Status: AC
  Filled 2024-11-11: qty 90, 45d supply, fill #0

## 2024-11-11 MED ORDER — LOSARTAN POTASSIUM 50 MG PO TABS
50.0000 mg | ORAL_TABLET | Freq: Every day | ORAL | 0 refills | Status: AC
  Filled 2024-11-11: qty 90, 90d supply, fill #0

## 2024-11-17 ENCOUNTER — Other Ambulatory Visit (HOSPITAL_COMMUNITY): Payer: Self-pay
